# Patient Record
Sex: Female | Born: 1970 | Race: White | Hispanic: No | Smoking: Former smoker
Health system: Southern US, Community
[De-identification: ages and names within clinical notes are randomized; demographics above are authoritative.]

## PROBLEM LIST (undated history)

## (undated) ENCOUNTER — Inpatient Hospital Stay: Admission: EM | Payer: Self-pay | Source: Home / Self Care

## (undated) DIAGNOSIS — R569 Unspecified convulsions: Secondary | ICD-10-CM

## (undated) DIAGNOSIS — J309 Allergic rhinitis, unspecified: Secondary | ICD-10-CM

## (undated) DIAGNOSIS — S329XXA Fracture of unspecified parts of lumbosacral spine and pelvis, initial encounter for closed fracture: Secondary | ICD-10-CM

## (undated) DIAGNOSIS — J45909 Unspecified asthma, uncomplicated: Secondary | ICD-10-CM

## (undated) DIAGNOSIS — G43809 Other migraine, not intractable, without status migrainosus: Secondary | ICD-10-CM

## (undated) DIAGNOSIS — L309 Dermatitis, unspecified: Secondary | ICD-10-CM

## (undated) HISTORY — DX: Unspecified asthma, uncomplicated: J45.909

## (undated) HISTORY — PX: ABDOMINAL HYSTERECTOMY: SHX81

## (undated) HISTORY — PX: ELBOW SURGERY: SHX618

## (undated) HISTORY — DX: Unspecified convulsions: R56.9

## (undated) HISTORY — PX: OTHER SURGICAL HISTORY: SHX169

## (undated) HISTORY — PX: OVARY SURGERY: SHX727

## (undated) HISTORY — DX: Dermatitis, unspecified: L30.9

## (undated) HISTORY — PX: SPINAL CORD STIMULATOR IMPLANT: SHX2422

## (undated) HISTORY — PX: PELVIC SYMPHYSIS FUSION: SHX2194

## (undated) HISTORY — PX: HAND SURGERY: SHX662

## (undated) HISTORY — PX: KNEE SURGERY: SHX244

## (undated) HISTORY — DX: Allergic rhinitis, unspecified: J30.9

## (undated) HISTORY — PX: CHOLECYSTECTOMY: SHX55

---

## 1999-06-05 ENCOUNTER — Ambulatory Visit (HOSPITAL_COMMUNITY): Admission: RE | Admit: 1999-06-05 | Discharge: 1999-06-05 | Payer: Self-pay | Admitting: Gynecology

## 2002-05-28 ENCOUNTER — Other Ambulatory Visit: Admission: RE | Admit: 2002-05-28 | Discharge: 2002-05-28 | Payer: Self-pay | Admitting: Gynecology

## 2003-02-10 ENCOUNTER — Encounter: Payer: Self-pay | Admitting: Cardiology

## 2003-02-10 ENCOUNTER — Ambulatory Visit (HOSPITAL_COMMUNITY): Admission: RE | Admit: 2003-02-10 | Discharge: 2003-02-10 | Payer: Self-pay | Admitting: Internal Medicine

## 2003-07-20 ENCOUNTER — Other Ambulatory Visit: Admission: RE | Admit: 2003-07-20 | Discharge: 2003-07-20 | Payer: Self-pay | Admitting: Gynecology

## 2004-05-23 ENCOUNTER — Encounter (INDEPENDENT_AMBULATORY_CARE_PROVIDER_SITE_OTHER): Payer: Self-pay | Admitting: Specialist

## 2004-05-23 ENCOUNTER — Ambulatory Visit (HOSPITAL_BASED_OUTPATIENT_CLINIC_OR_DEPARTMENT_OTHER): Admission: RE | Admit: 2004-05-23 | Discharge: 2004-05-23 | Payer: Self-pay | Admitting: Gynecology

## 2004-05-23 ENCOUNTER — Ambulatory Visit (HOSPITAL_COMMUNITY): Admission: RE | Admit: 2004-05-23 | Discharge: 2004-05-23 | Payer: Self-pay | Admitting: Gynecology

## 2005-06-20 ENCOUNTER — Other Ambulatory Visit: Admission: RE | Admit: 2005-06-20 | Discharge: 2005-06-20 | Payer: Self-pay | Admitting: Gynecology

## 2005-09-13 ENCOUNTER — Observation Stay (HOSPITAL_COMMUNITY): Admission: RE | Admit: 2005-09-13 | Discharge: 2005-09-14 | Payer: Self-pay | Admitting: Obstetrics and Gynecology

## 2005-09-13 ENCOUNTER — Encounter (INDEPENDENT_AMBULATORY_CARE_PROVIDER_SITE_OTHER): Payer: Self-pay | Admitting: Specialist

## 2005-09-26 ENCOUNTER — Encounter: Payer: Self-pay | Admitting: Emergency Medicine

## 2005-09-26 ENCOUNTER — Inpatient Hospital Stay (HOSPITAL_COMMUNITY): Admission: AD | Admit: 2005-09-26 | Discharge: 2005-09-26 | Payer: Self-pay | Admitting: Obstetrics and Gynecology

## 2008-07-15 ENCOUNTER — Emergency Department (HOSPITAL_COMMUNITY): Admission: EM | Admit: 2008-07-15 | Discharge: 2008-07-15 | Payer: Self-pay | Admitting: Emergency Medicine

## 2009-10-06 ENCOUNTER — Ambulatory Visit (HOSPITAL_BASED_OUTPATIENT_CLINIC_OR_DEPARTMENT_OTHER)
Admission: RE | Admit: 2009-10-06 | Discharge: 2009-10-06 | Payer: Self-pay | Admitting: Physical Medicine and Rehabilitation

## 2009-11-23 ENCOUNTER — Ambulatory Visit (HOSPITAL_COMMUNITY): Admission: RE | Admit: 2009-11-23 | Discharge: 2009-11-24 | Payer: Self-pay | Admitting: Orthopedic Surgery

## 2010-06-07 ENCOUNTER — Encounter
Admission: RE | Admit: 2010-06-07 | Discharge: 2010-06-07 | Payer: Self-pay | Admitting: Physical Medicine and Rehabilitation

## 2011-04-04 LAB — CBC
HCT: 40.2 % (ref 36.0–46.0)
Hemoglobin: 14 g/dL (ref 12.0–15.0)
MCV: 88.7 fL (ref 78.0–100.0)
RBC: 4.53 MIL/uL (ref 3.87–5.11)
RDW: 12.7 % (ref 11.5–15.5)
WBC: 7.1 10*3/uL (ref 4.0–10.5)

## 2011-05-18 NOTE — H&P (Signed)
NAME:  CACHE, DECOURSEY NO.:  [id number]   MEDICAL RECORD NO.:  [id number]          PATIENT TYPE:  AMB   LOCATION:  [facility]                            FACILITY:   PHYSICIAN:  Juluis Mire, M.D.   DATE OF BIRTH:  07/10/1971   DATE OF ADMISSION:  09/13/2005  DATE OF DISCHARGE:                                HISTORY & PHYSICAL   The patient is a 40 year old gravida 5, para 3, abortus 2 married white  female who presents for laparoscopically-assisted vaginal hysterectomy.   In relation to the present admission, the patient has regular cyclic periods  at the present time.  She has severe pain that starts from mid cycle up to  her period.  Pain tends then to worsen with her cycle.  This pain is  becoming limiting and very uncomfortable for her.  She is not responding to  over-the-counter management.  Her past gynecological history is significant  in that she had what was described as a history of polycystic ovarian  syndrome; although she has always had regular cyclic periods and had no  trouble conceiving.  At age 64, she evidently underwent exploratory surgery.  She was told at that time that her right tube and ovary were missing and she  evidently had some torsion of the left ovary and was told she had a wedge  resection done at that time.  She at the present time has 3 days of  relatively heavy flow with her cycles, but again, the pain is the most  significant issue.  Subsequently, in 2000 she had a laparoscopy done by Dr.  Beather Arbour for evaluation of pelvic pain.  At that time, there was no  evidence of endometriosis.  The right ovary was missing.  She did have  extensive adhesions from the omentum to the anterior abdominal wall and  there were some adhesions in the left ovary that were taken down.  Subsequently, she had a hysteroscopy done in 2005 for abnormal uterine  bleeding unresponsive to conservative therapy and had an endometrial  ablation.  Again, she  has not responded to this in terms of her menstrual  flow or continued pelvic pain.  We did an ultrasound here.  She ha a 2.2 cm  posterior wall fibroid.  We could not fill the intrauterine cavity with  fluid due to the prior endometrial ablation.  Therefore, we are dealing with  menorrhagia and continued pelvic pain unresponsive to an attempt at  conservative management.  We had discussed options with her.  These include  agents such as birth control pills or Depo-Provera.  However, she does have  a history of migraine headaches and worries about this.  We could just  proceed with laparoscopy versus hysterectomy.  The patient is in favor of  the latter procedure, for which she is admitted at the present time.   In terms of allergies, she is allergic to IODINE.   MEDICATIONS:  Glucophage.   PAST MEDICAL HISTORY:  1.  History of migraine headaches.  2.  History of recurrent urinary tract infections.  PAST SURGICAL HISTORY:  1.  Again, at age 30 she has an exploratory surgery with wedge resection of      the left ovary.  2.  As noted above, she had the diagnostic laparoscopy and subsequently last      year hysteroscopy and endometrial ablation.   OBSTETRICAL HISTORY:  She has had three vaginal deliveries and two  miscarriages.   FAMILY HISTORY:  1.  Maternal grandmother with history of diabetes.  2.  Mother and father have a history of hypertension.  3.  Son has a history of seizure disorder.  4.  Maternal grandmother with thyroid disorder.   SOCIAL HISTORY:  No tobacco or alcohol use.   REVIEW OF SYSTEMS:  Noncontributory.   PHYSICAL EXAMINATION:  VITAL SIGNS:  The patient is afebrile with stable  vital signs.  HEENT:  The patient is normocephalic.  Pupils are equal, round, and reactive  to light and accommodation.  Extraocular movements are intact.  Sclerae and  conjunctivae clear.  Oropharynx clear.  NECK:  Without thyromegaly.  BREASTS:  No discrete masses.  LUNGS:   Clear.  CARDIAC SYSTEM:  Regular rhythm and rate without murmurs or gallops.  ABDOMEN:  Benign.  No masses, organomegaly, or tenderness.  Prior  exploratory surgery as noted.  PELVIC:  Normal external genitalia.  Vaginal mucosa is clear.  Cervix  unremarkable.  Uterus is in the upper limits of normal size, moderately  tender.  Adnexa unremarkable.  EXTREMITIES:  Trace edema.  NEUROLOGICAL EXAM:  Grossly within normal limits.   IMPRESSION:  1.  Continue pelvic pain and menorrhagia, possible adenomyosis.  2.  Known pelvic adhesions.  3.  Uterine fibroids.  4.  Questionable history of polycystic ovarian disease.   PLAN:  The patient is to undergo a laparoscopically-assisted vaginal  hysterectomy.  The remaining ovary will be left in place.  The risks of  surgery have been discussed, including the risk of infection.  The risk of  hemorrhage could require a transfusion with the risk of AIDS or hepatitis.  The risk of injury to adjacent organs including bladder, bowel, or ureters  could require further exploratory surgery.  The risk of deep vein thrombosis  and pulmonary embolus.  She does understand with the ovary left in place,  there could be a problem with persistent pelvic pain and discomfort that  could require further surgical management.      Juluis Mire, M.D.  Electronically Signed     JSM/MEDQ  D:  09/13/2005  T:  09/13/2005  Job:  [id number]

## 2011-05-18 NOTE — Op Note (Signed)
NAME:  Christine, Miranda                  ACCOUNT NO.:  [id number]   MEDICAL RECORD NO.:  [id number]          PATIENT TYPE:  OBV   LOCATION:  9399                          FACILITY:  [facility]   PHYSICIAN:  Juluis Mire, M.D.   DATE OF BIRTH:  [birth year]   DATE OF PROCEDURE:  09/13/2005  DATE OF DISCHARGE:                                 OPERATIVE REPORT   PREOPERATIVE DIAGNOSES:  1.  Menorrhagia.  2.  Pelvic pain.  3.  Probable uterine adenomyosis.   POSTOPERATIVE DIAGNOSES:  1.  Menorrhagia.  2.  Pelvic pain.  3.  Probable uterine adenomyosis.   OPERATIVE PROCEDURE:  Laparoscopically-assisted vaginal hysterectomy.   ANESTHESIA:  General endotracheal.   ESTIMATED BLOOD LOSS:  Approximately 300 to 400 mL.   PACKS AND DRAINS:  None.   INTRAOPERATIVE BLOOD REPLACEMENT:  None.   COMPLICATIONS:  None.   INDICATION:  Indications are dictated in the hysterectomy and physical.   PROCEDURE:  The patient was taken to the OR and placed in the supine  position.  After satisfactory level of general endotracheal anesthesia was  obtained, the patient was placed in dorsal lithotomy position using the  Allen stirrups.  The abdomen, perineum, and vagina were prepped out with an  Ancef solution.  The bladder was emptied by in-and-out catheterization.  A  Hulka tenaculum was put in place and secured.  The patient was draped in a  sterile field.  Subumbilical incision made with the knife.  The incision was  extended to the fascia and the fascia was entered sharply.  The muscles were  separated and the peritoneum was entered bluntly with finger pressure.  The  Todd laparoscopic trocar was put in place and secured.  The abdomen was  inflated with carbon dioxide.  The laparoscope was introduced.  There was no  evidence of injury to adjacent organs.  A 5 mm trocar was then placed in the  suprapubic area under direct visualization.  The uterus was upper limits of  normal size.  It appeared that the right  tube and ovary were absent.  The  left ovary was unremarkable.  The uterus was enlarged, consistent with  adenomyosis.  There were no pelvic adhesions or any signs of active process  such as endometriosis.  The appendix was visualized and noted to be normal.  The gallbladder was surgically absent.  At this point in time using the  Gyrus, first the broad ligament on the right side was cauterized, incised,  and separated from the right side of the uterus.  Then on the left side, the  left utero-ovarian pedicle was cauterized and incised.  The left tube and  mesosalpinx were cauterized and incised and the left round ligament was  cauterized and incised.  We also continued the cautery and incision down to  the broad ligament.  We developed the bladder flap using the Gyrus.  We had  good hemostasis and freeing up of the uterus.  At this point in time, the  abdomen was inflated with carbon dioxide and the laparoscope was removed.  The  patient's legs were repositioned and the Hulka tenaculum then removed.  A weighted speculum was placed in the vaginal vault.  The cervix was  captured with Christella Hartigan tenaculum.  Cul-de-sac was entered sharply.  Both  uterosacral ligaments were clamped, cut, and suture ligated with 0 Vicryl.  The reflection of the vaginal mucosa anteriorly was incised and the bladder  was dissected superiorly.  Paracervical tissue was clamped, cut, and suture  ligated with 0 Vicryl.  The vesicouterine space was entered and a retractor  is put in place.  Using the clamp, cut, and tie technique with suture  ligatures of 0 Vicryl, the parametrium was serially separated from the sides  of the uterus.  The uterus was then flipped.  The remaining pedicles were  clamped and cut and the uterus was passed off of the operative field.  Pedicles were secured with free ties of 0 Vicryl.  She had good hemostasis.  The vaginal mucosa was reapproximated in the midline with interrupted figure-  of-8s  and 0 Vicryl.  The Foley was placed to straight drainage with  retrieval of an adequate amount of clear urine.  A sponge on a sponge stick  was placed in the vaginal vault.   The patient's legs were repositioned and the laparoscope was reintroduced.  The abdomen was reinflated with carbon dioxide.  We irrigated the pelvis.  We had some oozing from the vaginal cuff, well controlled with the Gyrus.  Otherwise, we had excellent hemostasis.  The laparoscope was then removed.  The suprapubic trocar was removed.  The Todd trocar was removed.  The  subumbilical fascia was closed with two figure-of-8s of 0 Vicryl, the skin  with interrupted subcuticulars of 4-0 Vicryl.  The suprapubic incision was  closed with Dermabond.  A sponge on a sponge stick was removed from the  vaginal vault.  The patient was taken out of the dorsal supine position.  She was alert and extubated and transferred to the room in good condition.  Sponge, instrument, and needle count was reported correct by the circulating  nurse x2.  Urine output remained clear at the time of closure.      Juluis Mire, M.D.  Electronically Signed     JSM/MEDQ  D:  09/13/2005  T:  09/13/2005  Job:  [id number]

## 2011-05-18 NOTE — Op Note (Signed)
NAME:  Christine Miranda, HACKEL NO.:  [id number]   MEDICAL RECORD NO.:  [id number]                   PATIENT TYPE:  AMB   LOCATION:  [facility]                                 FACILITY:  [facility]   PHYSICIAN:  Gretta Cool, M.D.              DATE OF BIRTH:  [birth year]   DATE OF PROCEDURE:  05/23/2004  DATE OF DISCHARGE:                                 OPERATIVE REPORT   PREOPERATIVE DIAGNOSES:  Abnormal uterine bleeding, persistent unresponsive  to conservative therapy.   POSTOPERATIVE DIAGNOSES:  Abnormal uterine bleeding, persistent unresponsive  to conservative therapy.   PROCEDURE:  Hysteroscopy, resection of the endometrium total for ablation  plus VaporTrode.   SURGEON:  Gretta Cool, M.D.   ANESTHESIA:  IV sedation and paracervical block.   DESCRIPTION OF PROCEDURE:  Under excellent paracervical block with IV  sedation with the patient prepped and draped in Allen stirrups, the cervix  was grasped with a single tooth tenaculum.  He was then progressively  dilated with a series of Pratt dilators to accommodate a 7 mm resectoscope.  The resectoscope was then introduced and the cavity photographed. There were  areas on both the anterior wall and the posterior wall that appeared to be  polyps. The entire endometrial cavity was then systematically resected down  at least 3-5 mm into the myometrium until there were no further gland  openings visible. Once the entire endometrial cavity had been resected, the  cavity was treated by VaporTrode so as to eliminate any islands of  endometrial tissue in the superficial myometrium.  At this point, the  cornual areas are treated by touch technique at reduced pressured.  The  bleeding points were cauterized and at this point, the procedure was  terminated without complications. The patient returned to the recovery room  in excellent condition.                                               Gretta Cool,  M.D.    CWL/MEDQ  D:  05/23/2004  T:  05/23/2004  Job:  [id number]

## 2011-05-18 NOTE — Discharge Summary (Signed)
NAME:  Christine Miranda, Christine Miranda                  ACCOUNT NO.:  [id number]   MEDICAL RECORD NO.:  [id number]          PATIENT TYPE:  OBV   LOCATION:  9320                          FACILITY:  [facility]   PHYSICIAN:  Juluis Mire, M.D.   DATE OF BIRTH:  07-24-1971   DATE OF ADMISSION:  09/13/2005  DATE OF DISCHARGE:  09/14/2005                                 DISCHARGE SUMMARY   ADMISSION DIAGNOSIS:  Adenomyosis.   DISCHARGE DIAGNOSIS:  Adenomyosis.   OPERATIVE PROCEDURE:  Laparoscopically-assisted vaginal hysterectomy.   For complete history and physical, please see the dictated note.   COURSE IN THE HOSPITAL:  The patient underwent laparoscopically-assisted  vaginal hysterectomy.  Postop hemoglobin was 11.1, discharged home on the  first postop day.  That morning she did have some nausea.  That afternoon,  she was tolerating a diet with no nausea.  She was afebrile with stable  vital signs.  Abdomen was soft, nontender, bowel sounds were active.  She  had passed flatus and was voiding without difficulty.   In terms of complications, none were encountered during the stay in the  hospital.  The patient was discharged home in stable condition.   DISPOSITION:  Routine postop instructions were given.  She is to avoid heavy  lifting, vaginal entry and driving a care.  She is to watch for signs of  infection, nausea or vomiting, increasing abdominal pain or active vaginal  bleeding.  Follow-up in the office will be in one week.  Sent home on  Percocet as she needs for pain.      Juluis Mire, M.D.  Electronically Signed     JSM/MEDQ  D:  09/14/2005  T:  09/14/2005  Job:  [id number]

## 2011-09-28 LAB — DIFFERENTIAL
Lymphocytes Relative: 24
Monocytes Relative: 7

## 2011-09-28 LAB — URINALYSIS, ROUTINE W REFLEX MICROSCOPIC
Bilirubin Urine: NEGATIVE
Ketones, ur: NEGATIVE
Specific Gravity, Urine: 1.027
pH: 5.5

## 2011-09-28 LAB — CBC
HCT: 42.9
Hemoglobin: 14.8
MCHC: 34.4
MCV: 86.4
Platelets: 278
RDW: 12.6
WBC: 11 — ABNORMAL HIGH

## 2011-09-28 LAB — COMPREHENSIVE METABOLIC PANEL
AST: 23
CO2: 24
Chloride: 106
GFR calc Af Amer: >60
Glucose, Bld: 101 — ABNORMAL HIGH
Potassium: 3.8
Sodium: 139
Total Bilirubin: 0.7

## 2013-07-14 ENCOUNTER — Encounter (HOSPITAL_COMMUNITY): Payer: Self-pay | Admitting: Emergency Medicine

## 2013-07-14 ENCOUNTER — Emergency Department (HOSPITAL_COMMUNITY): Payer: BC Managed Care – PPO

## 2013-07-14 ENCOUNTER — Emergency Department (HOSPITAL_COMMUNITY)
Admission: EM | Admit: 2013-07-14 | Discharge: 2013-07-15 | Disposition: A | Discharge status: Home / Self Care | Payer: BC Managed Care – PPO | Attending: Emergency Medicine | Admitting: Emergency Medicine

## 2013-07-14 DIAGNOSIS — N83202 Unspecified ovarian cyst, left side: Secondary | ICD-10-CM

## 2013-07-14 DIAGNOSIS — Z79899 Other long term (current) drug therapy: Secondary | ICD-10-CM | POA: Insufficient documentation

## 2013-07-14 DIAGNOSIS — R109 Unspecified abdominal pain: Secondary | ICD-10-CM

## 2013-07-14 DIAGNOSIS — N83209 Unspecified ovarian cyst, unspecified side: Secondary | ICD-10-CM | POA: Insufficient documentation

## 2013-07-14 DIAGNOSIS — Z8781 Personal history of (healed) traumatic fracture: Secondary | ICD-10-CM | POA: Insufficient documentation

## 2013-07-14 DIAGNOSIS — G43909 Migraine, unspecified, not intractable, without status migrainosus: Secondary | ICD-10-CM | POA: Insufficient documentation

## 2013-07-14 HISTORY — DX: Fracture of unspecified parts of lumbosacral spine and pelvis, initial encounter for closed fracture: S32.9XXA

## 2013-07-14 HISTORY — DX: Other migraine, not intractable, without status migrainosus: G43.809

## 2013-07-14 LAB — COMPREHENSIVE METABOLIC PANEL
ALT: 14 U/L (ref 0–35)
AST: 17 U/L (ref 0–37)
Albumin: 4.3 g/dL (ref 3.5–5.2)
Creatinine, Ser: 0.49 mg/dL — ABNORMAL LOW (ref 0.50–1.10)
GFR calc Af Amer: >90 mL/min (ref >90)
GFR calc non Af Amer: >90 mL/min (ref >90)
Glucose, Bld: 113 mg/dL — ABNORMAL HIGH (ref 70–99)
Sodium: 138 mEq/L (ref 135–145)
Total Protein: 7.8 g/dL (ref 6.0–8.3)

## 2013-07-14 LAB — CBC WITH DIFFERENTIAL/PLATELET
Basophils Relative: 0 % (ref 0–1)
Eosinophils Absolute: 0.1 10*3/uL (ref 0.0–0.7)
Eosinophils Relative: 2 % (ref 0–5)
HCT: 41.6 % (ref 36.0–46.0)
MCH: 29.6 pg (ref 26.0–34.0)
MCHC: 35.8 g/dL (ref 30.0–36.0)
Monocytes Absolute: 0.8 10*3/uL (ref 0.1–1.0)
Monocytes Relative: 8 % (ref 3–12)
Neutro Abs: 4.5 10*3/uL (ref 1.7–7.7)
Platelets: 253 10*3/uL (ref 150–400)
RDW: 12.5 % (ref 11.5–15.5)
WBC: 9.4 10*3/uL (ref 4.0–10.5)

## 2013-07-14 LAB — URINALYSIS, ROUTINE W REFLEX MICROSCOPIC
Bilirubin Urine: NEGATIVE
Hgb urine dipstick: NEGATIVE
Ketones, ur: NEGATIVE mg/dL
Protein, ur: NEGATIVE mg/dL
Specific Gravity, Urine: 1.013 (ref 1.005–1.030)

## 2013-07-14 MED ORDER — KETOROLAC TROMETHAMINE 30 MG/ML IJ SOLN
30.0000 mg | Freq: Once | INTRAMUSCULAR | Status: AC
  Administered 2013-07-14: 30 mg via INTRAVENOUS
  Filled 2013-07-14: qty 1

## 2013-07-14 MED ORDER — HYDROMORPHONE HCL PF 1 MG/ML IJ SOLN
1.0000 mg | Freq: Once | INTRAMUSCULAR | Status: AC
  Administered 2013-07-14: 1 mg via INTRAVENOUS
  Filled 2013-07-14: qty 1

## 2013-07-14 MED ORDER — FENTANYL CITRATE 0.05 MG/ML IJ SOLN
50.0000 ug | Freq: Once | INTRAMUSCULAR | Status: AC
  Administered 2013-07-14: 50 ug via INTRAVENOUS
  Filled 2013-07-14: qty 2

## 2013-07-14 MED ORDER — POTASSIUM CHLORIDE CRYS ER 20 MEQ PO TBCR
40.0000 meq | EXTENDED_RELEASE_TABLET | Freq: Once | ORAL | Status: AC
  Administered 2013-07-14: 40 meq via ORAL
  Filled 2013-07-14: qty 2

## 2013-07-14 MED ORDER — SODIUM CHLORIDE 0.9 % IV SOLN
Freq: Once | INTRAVENOUS | Status: AC
  Administered 2013-07-14: 20:00:00 via INTRAVENOUS

## 2013-07-14 NOTE — ED Notes (Signed)
Pt states she has been having left lower abdominal pain all day that worsened this evening.  Pt states pain is now severe and she is unable to walk-pt brought from waiting area to room to be triaged.  Pt c/o nausea but no emesis.  Pt states she has not been able to take any pain meds today because of the nausea.  Pt states she is in pain management clinic and has a spinal stimulator.

## 2013-07-14 NOTE — ED Provider Notes (Signed)
History    CSN: [id number] Arrival date & time 07/14/13  1939  First MD Initiated Contact with Patient 07/14/13 2007     Chief Complaint  Patient presents with  . Abdominal Pain   (Consider location/radiation/quality/duration/timing/severity/associated sxs/prior Treatment) HPI Comments: 42 yo female with left lower pelvic pain since earlier tonight.  Severe, gradual onset, similar to previous cyst hx.  Pt on dilaudid at home for back pain.  Pain mgmt clinic.  No vaginal sxs.  Non radiating.  Sharp/ ache.  Pt had partial hysterectomy hx.    Patient is a 42 y.o. female presenting with abdominal pain. The history is provided by the patient.  Abdominal Pain This is a recurrent problem. Associated symptoms include abdominal pain. Pertinent negatives include no chest pain, no headaches and no shortness of breath.   Past Medical History  Diagnosis Date  . Pelvic fracture   . Lower half migraine    Past Surgical History  Procedure Laterality Date  . Abdominal hysterectomy    . Spinal cord stimulator implant     History reviewed. No pertinent family history. History  Substance Use Topics  . Smoking status: Never Smoker   . Smokeless tobacco: Not on file  . Alcohol Use: No   OB History   Grav Para Term Preterm Abortions TAB SAB Ect Mult Living                 Review of Systems  Constitutional: Negative for fever and chills.  HENT: Negative for neck pain and neck stiffness.   Eyes: Negative for visual disturbance.  Respiratory: Negative for shortness of breath.   Cardiovascular: Negative for chest pain.  Gastrointestinal: Positive for nausea and abdominal pain. Negative for vomiting.  Genitourinary: Positive for pelvic pain. Negative for dysuria and flank pain.  Musculoskeletal: Negative for back pain.  Skin: Negative for rash.  Neurological: Negative for light-headedness and headaches.    Allergies  Hydrocodone; Iodine; and Prednisone  Home Medications   Current  Outpatient Rx  Name  Route  Sig  Dispense  Refill  . HYDROmorphone (DILAUDID) 4 MG tablet   Oral   Take 4 mg by mouth every 4 (four) hours as needed for pain.         Marland Kitchen HYDROmorphone HCl (EXALGO) 16 MG T24A   Oral   Take 32 mg by mouth at bedtime.         . methocarbamol (ROBAXIN) 750 MG tablet   Oral   Take 750 mg by mouth 4 (four) times daily as needed (for muscle spasms).         . methotrexate (RHEUMATREX) 2.5 MG tablet   Oral   Take 12.5 mg by mouth once a week. Caution:Chemotherapy. Protect from light.  Taken on Tuesdays.          BP 143/88  Pulse 104  Temp(Src) 98.3 F (36.8 C) (Oral)  Resp 19  SpO2 100% Physical Exam  Nursing note and vitals reviewed. Constitutional: She is oriented to person, place, and time. She appears well-developed and well-nourished.  HENT:  Head: Normocephalic and atraumatic.  Eyes: Conjunctivae are normal. Right eye exhibits no discharge. Left eye exhibits no discharge.  Neck: Normal range of motion. Neck supple. No tracheal deviation present.  Cardiovascular: Regular rhythm.   Pulmonary/Chest: Effort normal and breath sounds normal.  Abdominal: Soft. She exhibits no distension. There is tenderness (left lower pelvic). There is no guarding.  Musculoskeletal: She exhibits no edema.  Neurological: She is alert and  oriented to person, place, and time.  Skin: Skin is warm. No rash noted.  Psychiatric: She has a normal mood and affect.    ED Course  Procedures (including critical care time) Labs Reviewed  COMPREHENSIVE METABOLIC PANEL - Abnormal; Notable for the following:    Potassium 3.2 (*)    Glucose, Bld 113 (*)    Creatinine, Ser 0.49 (*)    All other components within normal limits  CBC WITH DIFFERENTIAL  URINALYSIS, ROUTINE W REFLEX MICROSCOPIC   US Transvaginal Non-ob  07/14/2013   *RADIOLOGY REPORT*  Clinical Data: Abdominal pain. Left lower quadrant pain.  TRANSABDOMINAL AND TRANSVAGINAL ULTRASOUND OF PELVIS  Technique:  Both transabdominal and transvaginal ultrasound examinations of the pelvis were performed. Transabdominal technique was performed for global imaging of the pelvis including uterus, ovaries, adnexal regions, and pelvic cul-de-sac.  It was necessary to proceed with endovaginal exam following the transabdominal exam to visualize the adnexal tissue.  Comparison:  09/26/2005  Findings:  Uterus: Surgically removed  Right ovary:  Not visualized.  Left ovary:   Left ovary measures 5.2 x 3.2 x 3.1 cm.  Within the left ovary, there is a hypoechoic structure that measures 3.2 x 2.6 x 2.4 cm.  There is no significant flow associated with the cystic structure.  Other findings: Small amount of free fluid.  IMPRESSION: Left ovarian cyst measuring up to 3.2 cm.  Small amount of free fluid in the pelvis.  Right ovary is not visualized.   Original Report Authenticated By: Richarda Overlie, M.D.   US Pelvis Complete  07/14/2013   *RADIOLOGY REPORT*  Clinical Data: Abdominal pain. Left lower quadrant pain.  TRANSABDOMINAL AND TRANSVAGINAL ULTRASOUND OF PELVIS Technique:  Both transabdominal and transvaginal ultrasound examinations of the pelvis were performed. Transabdominal technique was performed for global imaging of the pelvis including uterus, ovaries, adnexal regions, and pelvic cul-de-sac.  It was necessary to proceed with endovaginal exam following the transabdominal exam to visualize the adnexal tissue.  Comparison:  09/26/2005  Findings:  Uterus: Surgically removed  Right ovary:  Not visualized.  Left ovary:   Left ovary measures 5.2 x 3.2 x 3.1 cm.  Within the left ovary, there is a hypoechoic structure that measures 3.2 x 2.6 x 2.4 cm.  There is no significant flow associated with the cystic structure.  Other findings: Small amount of free fluid.  IMPRESSION: Left ovarian cyst measuring up to 3.2 cm.  Small amount of free fluid in the pelvis.  Right ovary is not visualized.   Original Report Authenticated By: Richarda Overlie, M.D.   1. Left ovarian cyst   2. Abdominal pain     MDM  Similar to previous.  Pt has ob gyn to call for fup.  Rechecked, pain improved in ED.  [place] reviewed, pt comfortable with out pt fup. DC  Enid Skeens, MD 07/14/13 [phone]

## 2015-07-09 ENCOUNTER — Ambulatory Visit (INDEPENDENT_AMBULATORY_CARE_PROVIDER_SITE_OTHER): Payer: 59 | Admitting: Urgent Care

## 2015-07-09 VITALS — BP 144/102 | HR 97 | Temp 98.6°F | Resp 18 | Ht 64.5 in | Wt 163.8 lb

## 2015-07-09 DIAGNOSIS — S61219A Laceration without foreign body of unspecified finger without damage to nail, initial encounter: Secondary | ICD-10-CM | POA: Diagnosis not present

## 2015-07-09 DIAGNOSIS — L089 Local infection of the skin and subcutaneous tissue, unspecified: Secondary | ICD-10-CM | POA: Diagnosis not present

## 2015-07-09 MED ORDER — SULFAMETHOXAZOLE-TRIMETHOPRIM 800-160 MG PO TABS: 1.0000 | ORAL_TABLET | Freq: Two times a day (BID) | ORAL | Status: DC

## 2015-07-09 NOTE — Patient Instructions (Signed)
Fingertip Infection °When an infection is around the nail, it is called a paronychia. When it appears over the tip of the finger, it is called a felon. These infections are due to minor injuries or cracks in the skin. If they are not treated properly, they can lead to bone infection and permanent damage to the fingernail. °Incision and drainage is necessary if a pus pocket (an abscess) has formed. Antibiotics and pain medicine may also be needed. Keep your hand elevated for the next 2-3 days to reduce swelling and pain. If a pack was placed in the abscess, it should be removed in 1-2 days by your caregiver. Soak the finger in warm water for 20 minutes 4 times daily to help promote drainage. °Keep the hands as dry as possible. Wear protective gloves with cotton liners. See your caregiver for follow-up care as recommended.  °HOME CARE INSTRUCTIONS  °· Keep wound clean, dry and dressed as suggested by your caregiver. °· Soak in warm salt water for fifteen minutes, four times per day for bacterial infections. °· Your caregiver will prescribe an antibiotic if a bacterial infection is suspected. Take antibiotics as directed and finish the prescription, even if the problem appears to be improving before the medicine is gone. °· Only take over-the-counter or prescription medicines for pain, discomfort, or fever as directed by your caregiver. °SEEK IMMEDIATE MEDICAL CARE IF: °· There is redness, swelling, or increasing pain in the wound. °· Pus or any other unusual drainage is coming from the wound. °· An unexplained oral temperature above 102° F (38.9° C) develops. °· You notice a foul smell coming from the wound or dressing. °MAKE SURE YOU:  °· Understand these instructions. °· Monitor your condition. °· Contact your caregiver if you are getting worse or not improving. °Document Released: 01/24/2005 Document Revised: 03/10/2012 Document Reviewed: 01/20/2009 °[employer]® Patient Information ©2015 [employer]. This  information is not intended to replace advice given to you by your health care provider. Make sure you discuss any questions you have with your health care provider. ° °

## 2015-07-09 NOTE — Progress Notes (Signed)
    MRN: [id number] DOB: 1971-03-15  Subjective:   Christine Miranda is a 44 y.o. female presenting for chief complaint of Hand Pain  Reports 5 day history of finger laceration of right index finger tip. She did not present to clinic then, wound started to close but in the last 2-3 days, developed pain, redness, swelling, oozing of pus. Has tried erythromycin once today. Denies fever, drainage of pus today, worsening swelling, decreased sensation or range of motion. Patient is right handed. Denies any other aggravating or relieving factors, no other questions or concerns.  Christine Miranda has a current medication list which includes the following prescription(s): hydromorphone, hydromorphone hcl, and methocarbamol. She is allergic to hydrocodone; iodine; and prednisone.  Christine Miranda  has a past medical history of Pelvic fracture and Lower half migraine. Also  has past surgical history that includes Abdominal hysterectomy and Spinal cord stimulator implant.  ROS As in subjective.  Objective:   Vitals: BP 144/102 mmHg  Pulse 97  Temp(Src) 98.6 F (37 C) (Oral)  Resp 18  Ht 5' 4.5" (1.638 m)  Wt 163 lb 12.8 oz (74.299 kg)  BMI 27.69 kg/m2  SpO2 98%  BP on recheck by PA-Khalib Fendley 126/88 on left arm at 15:14.  Physical Exam  Constitutional: She is oriented to person, place, and time. She appears well-developed and well-nourished.  Cardiovascular: Normal rate.   Pulmonary/Chest: Effort normal.  Musculoskeletal:       Right hand: She exhibits tenderness (over right index fingertip) and laceration. She exhibits normal range of motion, no bony tenderness, normal capillary refill, no deformity and no swelling. Normal sensation noted. Normal strength noted.       Hands: Neurological: She is alert and oriented to person, place, and time.   Assessment and Plan :   1. Laceration of finger with infection, initial encounter - Will start Septra to cover for MRSA since patient has nausea and stomach upset with  doxycycline. No wound culture or I&D necessary given lack of fluctuance or pus expressed on exam. The fact that wound is closed will not need sutures. - RTC as needed.  Jaynee Eagles, PA-C [facility] [phone] 07/09/2015 2:53 PM

## 2015-10-30 ENCOUNTER — Ambulatory Visit (INDEPENDENT_AMBULATORY_CARE_PROVIDER_SITE_OTHER): Payer: 59 | Admitting: Internal Medicine

## 2015-10-30 VITALS — BP 138/78 | HR 85 | Temp 98.4°F | Resp 16 | Ht 65.0 in | Wt 171.4 lb

## 2015-10-30 DIAGNOSIS — M7989 Other specified soft tissue disorders: Secondary | ICD-10-CM | POA: Diagnosis not present

## 2015-10-30 NOTE — Progress Notes (Signed)
   Subjective:  This chart was scribed for Tami Lin, MD by Thea Alken, ED Scribe. This patient was seen in room 1 and the patient's care was started at 2:38 PM.  Patient ID: Christine Miranda, female    DOB: 1971-03-21, 44 y.o.   MRN: [id number]  HPI   Chief Complaint  Patient presents with  . needs ring cut off    ring on right middle finger needs to be cut off.  finger is swollen and pt has tried every attempt to get the ring off   HPI Comments: Christine Miranda is a 44 y.o. female who presents to the [facility] for removal of a ring. Pt has a ring on her right middle finger that she is needing to be removed. She tried multiple things last night including a dental floss trick. She denies having pain to finger but has had swelling from trying to removed the ring.   Past Medical History  Diagnosis Date  . Pelvic fracture ([place])   . Lower half migraine    Prior to Admission medications   Medication Sig Start Date End Date Taking? Authorizing Provider  HYDROmorphone (DILAUDID) 4 MG tablet Take 4 mg by mouth every 4 (four) hours as needed for pain.   Yes Historical Provider, MD  HYDROmorphone HCl (EXALGO) 16 MG T24A Take 32 mg by mouth at bedtime.   Yes Historical Provider, MD  methocarbamol (ROBAXIN) 750 MG tablet Take 750 mg by mouth 4 (four) times daily as needed (for muscle spasms).   Yes Historical Provider, MD  sulfamethoxazole-trimethoprim (BACTRIM DS,SEPTRA DS) 800-160 MG per tablet Take 1 tablet by mouth 2 (two) times daily. Patient not taking: Reported on 10/30/2015 07/09/15   Jaynee Eagles, PA-C    Review of Systems  Skin: Negative for color change and wound.  Neurological: Negative for weakness and numbness.    Objective:   Physical Exam  Constitutional: She is oriented to person, place, and time. She appears well-developed and well-nourished. No distress.  HENT:  Head: Normocephalic and atraumatic.  Eyes: Conjunctivae and EOM are normal.  Neck: Neck  supple.  Cardiovascular: Normal rate.   Pulmonary/Chest: Effort normal.  Musculoskeletal: Normal range of motion.  Neurological: She is alert and oriented to person, place, and time.  Skin: Skin is warm and dry.  Right middle finger is mildly swollen at PIP joint. Ring that is unable to be removed.   Psychiatric: She has a normal mood and affect. Her behavior is normal.  Nursing note and vitals reviewed.  Filed Vitals:   10/30/15 1339  BP: 138/78  Pulse: 85  Temp: 98.4 F (36.9 C)  TempSrc: Oral  Resp: 16  Height: 5\' 5"  (1.651 m)  Weight: 171 lb 6 oz (77.735 kg)  SpO2: 98%    Procedre: with ringcutter this ring was cut ,spread w/ forceps and removed No lac Joint wnl  Assessment & Plan:  Swollen finger unable to remove ring  By signing my name below, I, Raven Small, attest that this documentation has been prepared under the direction and in the presence of Tami Lin, MD.  Electronically Signed: Thea Alken, ED Scribe. 10/30/2015. 3:06 PM.  I have completed the patient encounter in its entirety as documented by the scribe, with editing by me where necessary. Orris Perin P. Laney Pastor, M.D.

## 2016-07-30 ENCOUNTER — Telehealth: Payer: Self-pay | Admitting: Physician Assistant

## 2016-07-30 ENCOUNTER — Ambulatory Visit (INDEPENDENT_AMBULATORY_CARE_PROVIDER_SITE_OTHER): Payer: 59 | Admitting: Physician Assistant

## 2016-07-30 ENCOUNTER — Ambulatory Visit (INDEPENDENT_AMBULATORY_CARE_PROVIDER_SITE_OTHER): Payer: 59

## 2016-07-30 ENCOUNTER — Encounter: Payer: Self-pay | Admitting: Physician Assistant

## 2016-07-30 VITALS — BP 160/100 | HR 121 | Temp 98.3°F | Resp 17 | Ht 65.0 in | Wt 167.0 lb

## 2016-07-30 DIAGNOSIS — G8929 Other chronic pain: Secondary | ICD-10-CM | POA: Insufficient documentation

## 2016-07-30 DIAGNOSIS — R05 Cough: Secondary | ICD-10-CM | POA: Diagnosis not present

## 2016-07-30 DIAGNOSIS — R000 Tachycardia, unspecified: Secondary | ICD-10-CM

## 2016-07-30 DIAGNOSIS — G43909 Migraine, unspecified, not intractable, without status migrainosus: Secondary | ICD-10-CM | POA: Insufficient documentation

## 2016-07-30 DIAGNOSIS — M549 Dorsalgia, unspecified: Secondary | ICD-10-CM

## 2016-07-30 DIAGNOSIS — R059 Cough, unspecified: Secondary | ICD-10-CM

## 2016-07-30 LAB — POCT CBC
Granulocyte percent: 70.8 %G (ref 37–80)
HCT, POC: 41.3 % (ref 37.7–47.9)
Hemoglobin: 15 g/dL (ref 12.2–16.2)
LYMPH, POC: 2.2 (ref 0.6–3.4)
MCH, POC: 30.7 pg (ref 27–31.2)
MCHC: 36.2 g/dL — AB (ref 31.8–35.4)
MCV: 84.7 fL (ref 80–97)
MID (cbc): 0.5 (ref 0–0.9)
MPV: 6.5 fL (ref 0–99.8)
PLATELET COUNT, POC: 291 10*3/uL (ref 142–424)
POC Granulocyte: 6.5 (ref 2–6.9)
POC LYMPH %: 23.9 % (ref 10–50)
POC MID %: 5.3 %M (ref 0–12)
RBC: 4.88 M/uL (ref 4.04–5.48)
RDW, POC: 12.9 %
WBC: 9.2 10*3/uL (ref 4.6–10.2)

## 2016-07-30 LAB — D-DIMER, QUANTITATIVE (NOT AT ARMC): D DIMER QUANT: 0.25 ug{FEU}/mL (ref <0.50)

## 2016-07-30 MED ORDER — MONTELUKAST SODIUM 10 MG PO TABS: 10.0000 mg | ORAL_TABLET | Freq: Every day | ORAL | 3 refills | Status: DC

## 2016-07-30 MED ORDER — LEVALBUTEROL HCL 0.63 MG/3ML IN NEBU
0.6300 mg | INHALATION_SOLUTION | Freq: Once | RESPIRATORY_TRACT | Status: AC
  Administered 2016-07-30: 0.63 mg via RESPIRATORY_TRACT

## 2016-07-30 MED ORDER — BENZONATATE 100 MG PO CAPS: 100.0000 mg | ORAL_CAPSULE | Freq: Three times a day (TID) | ORAL | 0 refills | Status: DC | PRN

## 2016-07-30 MED ORDER — LEVALBUTEROL TARTRATE 45 MCG/ACT IN AERO: 1.0000 | INHALATION_SPRAY | RESPIRATORY_TRACT | 3 refills | Status: DC | PRN

## 2016-07-30 MED ORDER — PROMETHAZINE-DM 6.25-15 MG/5ML PO SYRP: 5.0000 mL | ORAL_SOLUTION | Freq: Four times a day (QID) | ORAL | 0 refills | Status: DC | PRN

## 2016-07-30 NOTE — Telephone Encounter (Signed)
LMOM on voice mail  -- neg D dimer - pt to monitor her pulse and BP - if her SOB worsens or does not continue to improve with Xopenex she will seek further evaluation - she will recheck as planned for her elevated BP

## 2016-07-30 NOTE — Patient Instructions (Addendum)
Use inhaler, singulair, tessalon, and cough syrup for cough We will call you with D-dimer results today Come in Monday 08/06/16 for bp recheck and referral to asthma specialist  If symptoms worsen, do not improve, seek care immediately   IF you received an x-ray today, you will receive an invoice from [facility]. Please contact [facility] at [phone] with questions or concerns regarding your invoice.   IF you received labwork today, you will receive an invoice from [employer]. Please contact [facility] at [phone] with questions or concerns regarding your invoice.   Our billing staff will not be able to assist you with questions regarding bills from these companies.  You will be contacted with the lab results as soon as they are available. The fastest way to get your results is to activate your My Chart account. Instructions are located on the last page of this paperwork. If you have not heard from [place] regarding the results in 2 weeks, please contact this office.

## 2016-07-30 NOTE — Progress Notes (Signed)
Christine Miranda  MRN: [id number] DOB: 01-28-1971  Subjective:  Christine Miranda is a 44 y.o. female seen in office today for a chief complaint of persistent productive cough x 5 days with thick clear sputum.   Associated SOB after coughing episodes, fatigue, wheezing at night, and seasonal allergies .Denies chills, chest pain,chest tightness, fever, sore throat, leg swelling, recent immobilization, use of hormones, history of cancer, and sick contacts at home.   Has tried OTC cough medicine with minimal relief.   Has history of chronic bronchitis since age 55. Around this time each year, has a flare. Thinks she may have asthma because albuterol and singulair typically help with these episodes but she has never been tested.   Of note, pt was just started on new medication Otezla for psoriasis two days prior to this coughing episode.   Review of Systems  Constitutional: Positive for appetite change. Negative for diaphoresis.  HENT: Positive for postnasal drip and sneezing. Negative for congestion.   Eyes: Negative for itching.  Gastrointestinal: Positive for diarrhea (from medicaiton). Negative for abdominal pain and vomiting.  Neurological: Negative for dizziness and light-headedness.    Patient Active Problem List   Diagnosis Date Noted  . Chronic back pain 07/30/2016  . Migraines 07/30/2016    Current Outpatient Prescriptions on File Prior to Visit  Medication Sig Dispense Refill  . methocarbamol (ROBAXIN) 750 MG tablet Take 750 mg by mouth 4 (four) times daily as needed (for muscle spasms).     No current facility-administered medications on file prior to visit.     Allergies  Allergen Reactions  . Hydrocodone Hives  . Iodine Hives  . Prednisone     Rapid heartrate  . Tincture Of Benzoin [Benzoin]     Blister   Family History  Problem Relation Age of Onset  . Hyperlipidemia Mother   . Hypertension Mother   . Hyperlipidemia Father   . Hypertension Father   . Deep vein  thrombosis Father   . Pulmonary embolism Father     Objective:  BP (!) 160/100 (BP Location: Left Arm, Patient Position: Sitting, Cuff Size: Normal)   Pulse (!) 121   Temp 98.3 F (36.8 C) (Oral)   Resp 17   Ht 5\' 5"  (1.651 m)   Wt 167 lb (75.8 kg)   SpO2 99%   BMI 27.79 kg/m   BP Readings from Last 3 Encounters:  07/30/16 (!) 160/100  10/30/15 138/78  07/09/15 (!) 144/102    Physical Exam  Constitutional: She is oriented to person, place, and time.  Well developed, well nourished, in moderate distress due to persistent cough.   HENT:  Head: Normocephalic and atraumatic.  Eyes: Conjunctivae are normal.  Neck: Normal range of motion.  Cardiovascular: Regular rhythm, normal heart sounds and intact distal pulses.  Tachycardia present.   Pulmonary/Chest: Effort normal. She has decreased breath sounds (diffusely over posterior lung field  ). She has no wheezes. She has no rhonchi. She has no rales.  Musculoskeletal:       Right lower leg: She exhibits no swelling.       Left lower leg: She exhibits no swelling.  No warmth or tenderness to palpation in lower legs bilaterally.  Neurological: She is alert and oriented to person, place, and time. Gait normal.  Skin: Skin is warm and dry.  Psychiatric: Affect normal.  Vitals reviewed.  Results for orders placed or performed in visit on 07/30/16 (from the past 24 hour(s))  POCT CBC  Status: Abnormal   Collection Time: 07/30/16 11:25 AM  Result Value Ref Range   WBC 9.2 4.6 - 10.2 K/uL   Lymph, poc 2.2 0.6 - 3.4   POC LYMPH PERCENT 23.9 10 - 50 %L   MID (cbc) 0.5 0 - 0.9   POC MID % 5.3 0 - 12 %M   POC Granulocyte 6.5 2 - 6.9   Granulocyte percent 70.8 37 - 80 %G   RBC 4.88 4.04 - 5.48 M/uL   Hemoglobin 15.0 12.2 - 16.2 g/dL   HCT, POC 41.3 37.7 - 47.9 %   MCV 84.7 80 - 97 fL   MCH, POC 30.7 27 - 31.2 pg   MCHC 36.2 (A) 31.8 - 35.4 g/dL   RDW, POC 12.9 %   Platelet Count, POC 291 142 - 424 K/uL   MPV 6.5 0 - 99.8 fL     EKG shows sinus tachycardia at 110 bpm with no acute changes  Dg Chest 2 View  Result Date: 07/30/2016 CLINICAL DATA:  Productive cough for 5 days with shortness of breath EXAM: CHEST  2 VIEW COMPARISON:  None. FINDINGS: Lungs are clear. Heart size and pulmonary vascularity are normal. No adenopathy. Thoracic stimulator tip is in the lower thoracic region. No bone lesions. IMPRESSION: No edema or consolidation. Electronically Signed   By: Lowella Grip III M.D.   On: 07/30/2016 11:35  Assessment and Plan :   1. Cough - POCT CBC - DG Chest 2 View; Future - levalbuterol (XOPENEX) nebulizer solution 0.63 mg; Take 3 mLs (0.63 mg total) by nebulization once. - Await results for D-dimer, quantitative (not at [facility])  - benzonatate (TESSALON) 100 MG capsule; Take 1-2 capsules (100-200 mg total) by mouth 3 (three) times daily as needed for cough.  Dispense: 40 capsule; Refill: 0 - promethazine-dextromethorphan (PROMETHAZINE-DM) 6.25-15 MG/5ML syrup; Take 5 mLs by mouth 4 (four) times daily as needed for cough.  Dispense: 118 mL; Refill: 0 - montelukast (SINGULAIR) 10 MG tablet; Take 1 tablet (10 mg total) by mouth at bedtime.  Dispense: 30 tablet; Refill: 3 - levalbuterol (XOPENEX HFA) 45 MCG/ACT inhaler; Inhale 1-2 puffs into the lungs every 4 (four) hours as needed for wheezing.  Dispense: 1 Inhaler; Refill: 3  2. Tachycardia -Other causes of tachycardia are being evaluated - D-dimer, quantitative (not at [facility]) - EKG 12-Lead  3. Elevated Blood pressure -BP recheck in office in one week after symptoms have improved and pt has rested sufficiently   -If symptoms worsen, seek medical care immediately   Tenna Delaine PA-C  [facility] 07/30/2016 12:30 PM

## 2016-08-01 ENCOUNTER — Encounter: Payer: Self-pay | Admitting: Physician Assistant

## 2016-08-06 ENCOUNTER — Ambulatory Visit (INDEPENDENT_AMBULATORY_CARE_PROVIDER_SITE_OTHER): Payer: 59 | Admitting: Physician Assistant

## 2016-08-06 ENCOUNTER — Encounter: Payer: Self-pay | Admitting: Physician Assistant

## 2016-08-06 VITALS — BP 120/80 | HR 93 | Temp 98.3°F | Resp 18 | Ht 65.0 in | Wt 169.0 lb

## 2016-08-06 DIAGNOSIS — R0602 Shortness of breath: Secondary | ICD-10-CM | POA: Diagnosis not present

## 2016-08-06 DIAGNOSIS — J309 Allergic rhinitis, unspecified: Secondary | ICD-10-CM | POA: Diagnosis not present

## 2016-08-06 DIAGNOSIS — R5382 Chronic fatigue, unspecified: Secondary | ICD-10-CM | POA: Diagnosis not present

## 2016-08-06 DIAGNOSIS — R05 Cough: Secondary | ICD-10-CM

## 2016-08-06 DIAGNOSIS — R059 Cough, unspecified: Secondary | ICD-10-CM

## 2016-08-06 LAB — TSH: TSH: 1.18 mIU/L

## 2016-08-06 MED ORDER — FLUTICASONE PROPIONATE 50 MCG/ACT NA SUSP: 2.0000 | Freq: Every day | NASAL | 6 refills | Status: DC

## 2016-08-06 MED ORDER — CETIRIZINE HCL 10 MG PO TABS: 10.0000 mg | ORAL_TABLET | Freq: Every day | ORAL | 11 refills | Status: DC

## 2016-08-06 MED ORDER — BECLOMETHASONE DIPROPIONATE 40 MCG/ACT IN AERS: 2.0000 | INHALATION_SPRAY | Freq: Every day | RESPIRATORY_TRACT | 12 refills | Status: DC

## 2016-08-06 MED ORDER — LEVALBUTEROL HCL 0.63 MG/3ML IN NEBU
0.6300 mg | INHALATION_SOLUTION | Freq: Once | RESPIRATORY_TRACT | Status: AC
  Administered 2016-08-06: 0.63 mg via RESPIRATORY_TRACT

## 2016-08-06 NOTE — Patient Instructions (Addendum)
Use zyrtec, flonase, and qvar daily. Albuterol nebs q 4-6 hrs during SOB episodes Avoid excessive time outdoors Pulm referral sent Sign up for my chart and let me know how medications are working for you Return to clinic if symptoms worsen, do not improve, or as needed     IF you received an x-ray today, you will receive an invoice from [facility]. Please contact [facility] at [phone] with questions or concerns regarding your invoice.   IF you received labwork today, you will receive an invoice from [employer]. Please contact [facility] at [phone] with questions or concerns regarding your invoice.   Our billing staff will not be able to assist you with questions regarding bills from these companies.  You will be contacted with the lab results as soon as they are available. The fastest way to get your results is to activate your My Chart account. Instructions are located on the last page of this paperwork. If you have not heard from [place] regarding the results in 2 weeks, please contact this office.

## 2016-08-06 NOTE — Progress Notes (Signed)
Christine Miranda  MRN: [id number] DOB: December 03, 1971  Subjective:  Christine Miranda is a 45 y.o. female seen in office today for a chief complaint of worsening nonproductive cough x 3 days.   Was seen on  07/30/16 for cough (refer to progress note). Pt notes that she was taking the tessalon perles, promethazine-dm, and singulair as prescribed and was also doing neulizers at home up until last Friday. She began to feel much better, was not waking up throughout the night anymore. BP and pulse were also better, in the normal range.   Pt notes she had a very exhausting weekend without much rest. Yesterday, she went outdoors for a walk and immediately felt throat constrict. Had associated coughing, chest tightness, shortness of breath during episodes, and fatigue. Denies chest pain, palpitations, and leg swelling. Has tried rescue inhaler and nebulizer without any relief.   Pt has never had a full work up by a pulmonologist, but was diagnosed at some point with chronic bronchitis since age 78.   Pt notes that being around mildew and being outside are triggers for these episodes.   In terms of steroids, prednisone shots cause increased heart rate for pt but she has never had corticosteroid inhaler.   Review of Systems  Constitutional: Negative for chills, diaphoresis and fever.  HENT: Positive for congestion (mild ). Negative for sore throat.   Endocrine: Positive for cold intolerance and heat intolerance.    Patient Active Problem List   Diagnosis Date Noted  . Chronic back pain 07/30/2016  . Migraines 07/30/2016    Current Outpatient Prescriptions on File Prior to Visit  Medication Sig Dispense Refill  . Apremilast (OTEZLA PO) Take by mouth.    . benzonatate (TESSALON) 100 MG capsule Take 1-2 capsules (100-200 mg total) by mouth 3 (three) times daily as needed for cough. 40 capsule 0  . HYDROmorphone (DILAUDID) 2 MG tablet Take 2 mg by mouth 4 (four) times daily.    Marland Kitchen levalbuterol (XOPENEX HFA)  45 MCG/ACT inhaler Inhale 1-2 puffs into the lungs every 4 (four) hours as needed for wheezing. 1 Inhaler 3  . methocarbamol (ROBAXIN) 750 MG tablet Take 750 mg by mouth 4 (four) times daily as needed (for muscle spasms).    . montelukast (SINGULAIR) 10 MG tablet Take 1 tablet (10 mg total) by mouth at bedtime. 30 tablet 3  . promethazine-dextromethorphan (PROMETHAZINE-DM) 6.25-15 MG/5ML syrup Take 5 mLs by mouth 4 (four) times daily as needed for cough. 118 mL 0   No current facility-administered medications on file prior to visit.     Allergies  Allergen Reactions  . Hydrocodone Hives  . Iodine Hives  . Prednisone     Rapid heartrate  . Tincture Of Benzoin [Benzoin]     Blister   Social History   Social History  . Marital status: Married    Spouse name: N/A  . Number of children: N/A  . Years of education: N/A   Occupational History  . Not on file.   Social History Main Topics  . Smoking status: Never Smoker  . Smokeless tobacco: Never Used  . Alcohol use No  . Drug use: No  . Sexual activity: Not on file   Other Topics Concern  . Not on file   Social History Narrative  . No narrative on file    Objective:  BP (!) 160/102 (BP Location: Right Arm, Patient Position: Sitting, Cuff Size: Small)   Pulse (!) 107   Temp 98.3  F (36.8 C) (Oral)   Resp 18   Ht 5\' 5"  (1.651 m)   Wt 169 lb (76.7 kg)   SpO2 98%   BMI 28.12 kg/m   Physical Exam  Constitutional: She is oriented to person, place, and time.  Well developed, well nourished, appears in mild distress due to persistent coughing episodes.   HENT:  Head: Normocephalic and atraumatic.  Nose: Mucosal edema present.  Mouth/Throat: Mucous membranes are normal. Posterior oropharyngeal erythema present.  Eyes: Right conjunctiva is injected.  Neck: Normal range of motion.  Cardiovascular: Regular rhythm.  Tachycardia present.   Pulmonary/Chest: Effort normal. She has decreased breath sounds. She has no wheezes.  She has no rhonchi. She has no rales.  Musculoskeletal:       Right lower leg: She exhibits no tenderness and no swelling.       Left lower leg: She exhibits no tenderness and no swelling.  Neurological: She is alert and oriented to person, place, and time. Gait normal.  Skin: Skin is warm and dry.  Psychiatric: Affect normal.  Vitals reviewed.  Patient's cough much improved after nebulizer treatment in office.    Assessment and Plan :   1. Cough 2. Shortness of breath -Suspect an irritant induced cough, educated on avoiding identifiable triggers - levalbuterol (XOPENEX) nebulizer solution 0.63 mg; Take 3 mLs (0.63 mg total) by nebulization once. - beclomethasone (QVAR) 40 MCG/ACT inhaler; Inhale 2 puffs into the lungs daily.  Dispense: 1 Inhaler; Refill: 12 - Ambulatory referral to Pulmonology  3. Chronic fatigue - TSH  4. Allergic rhinitis, unspecified allergic rhinitis type -Educated on importance on gaining better control over allergies - cetirizine (ZYRTEC) 10 MG tablet; Take 1 tablet (10 mg total) by mouth daily.  Dispense: 30 tablet; Refill: 11 - fluticasone (FLONASE) 50 MCG/ACT nasal spray; Place 2 sprays into both nostrils daily.  Dispense: 16 g; Refill: 6  -Return to clinic if symptoms worsen, do not improve, or as needed  Tenna Delaine PA-C  [facility] 08/06/2016 8:54 AM

## 2016-08-08 ENCOUNTER — Encounter: Payer: Self-pay | Admitting: Physician Assistant

## 2016-08-08 ENCOUNTER — Other Ambulatory Visit: Payer: Self-pay | Admitting: Physician Assistant

## 2016-08-08 DIAGNOSIS — R0602 Shortness of breath: Secondary | ICD-10-CM

## 2016-08-08 MED ORDER — LEVALBUTEROL HCL 0.63 MG/3ML IN NEBU: 0.6300 mg | INHALATION_SOLUTION | Freq: Once | RESPIRATORY_TRACT | 12 refills | Status: DC

## 2016-08-08 NOTE — Progress Notes (Signed)
Pt requested xopenex nebulizer we gave her in office for shortness of breath/coughing spells, as her albuterol nebulizers increase her heart rate and she has not experienced these symptoms when she received the xopenex nebulizer treatments in our office the past two visits.   Tenna Delaine, PA-C  [facility] 08/08/2016 3:37 PM

## 2016-08-21 ENCOUNTER — Encounter: Payer: Self-pay | Admitting: Physician Assistant

## 2016-08-21 DIAGNOSIS — R05 Cough: Secondary | ICD-10-CM

## 2016-08-21 DIAGNOSIS — R059 Cough, unspecified: Secondary | ICD-10-CM

## 2016-08-23 ENCOUNTER — Encounter: Payer: Self-pay | Admitting: Physician Assistant

## 2016-08-23 MED ORDER — BENZONATATE 100 MG PO CAPS: 100.0000 mg | ORAL_CAPSULE | Freq: Three times a day (TID) | ORAL | 1 refills | Status: DC | PRN

## 2016-08-23 NOTE — Telephone Encounter (Signed)
Pt requested refill of tessalon perles due to coughing episode she is experiencing after being outdoors. I have given her a prescription for tessalon perles with one refill.  Tenna Delaine, PA-C  [facility] 08/23/2016 10:05 PM

## 2016-08-25 ENCOUNTER — Encounter: Payer: Self-pay | Admitting: Physician Assistant

## 2016-09-18 ENCOUNTER — Ambulatory Visit (INDEPENDENT_AMBULATORY_CARE_PROVIDER_SITE_OTHER): Payer: 59 | Admitting: Pulmonary Disease

## 2016-09-18 ENCOUNTER — Encounter: Payer: Self-pay | Admitting: Pulmonary Disease

## 2016-09-18 ENCOUNTER — Other Ambulatory Visit (INDEPENDENT_AMBULATORY_CARE_PROVIDER_SITE_OTHER): Payer: 59

## 2016-09-18 VITALS — BP 142/84 | HR 102 | Ht 66.0 in | Wt 172.8 lb

## 2016-09-18 DIAGNOSIS — J4550 Severe persistent asthma, uncomplicated: Secondary | ICD-10-CM

## 2016-09-18 DIAGNOSIS — J309 Allergic rhinitis, unspecified: Secondary | ICD-10-CM | POA: Insufficient documentation

## 2016-09-18 DIAGNOSIS — R05 Cough: Secondary | ICD-10-CM

## 2016-09-18 DIAGNOSIS — J302 Other seasonal allergic rhinitis: Secondary | ICD-10-CM

## 2016-09-18 DIAGNOSIS — R059 Cough, unspecified: Secondary | ICD-10-CM

## 2016-09-18 DIAGNOSIS — J45909 Unspecified asthma, uncomplicated: Secondary | ICD-10-CM | POA: Insufficient documentation

## 2016-09-18 LAB — CBC WITH DIFFERENTIAL/PLATELET
BASOS ABS: 0 10*3/uL (ref 0.0–0.1)
Basophils Relative: 0.4 % (ref 0.0–3.0)
EOS ABS: 0.1 10*3/uL (ref 0.0–0.7)
Eosinophils Relative: 0.7 % (ref 0.0–5.0)
HEMATOCRIT: 43.1 % (ref 36.0–46.0)
Hemoglobin: 15 g/dL (ref 12.0–15.0)
LYMPHS PCT: 19.5 % (ref 12.0–46.0)
Lymphs Abs: 1.6 10*3/uL (ref 0.7–4.0)
MCHC: 34.9 g/dL (ref 30.0–36.0)
MCV: 86.3 fl (ref 78.0–100.0)
Monocytes Absolute: 0.6 10*3/uL (ref 0.1–1.0)
Monocytes Relative: 7.1 % (ref 3.0–12.0)
NEUTROS ABS: 6.1 10*3/uL (ref 1.4–7.7)
Neutrophils Relative %: 72.3 % (ref 43.0–77.0)
PLATELETS: 307 10*3/uL (ref 150.0–400.0)
RBC: 5 Mil/uL (ref 3.87–5.11)
RDW: 12.7 % (ref 11.5–15.5)
WBC: 8.4 10*3/uL (ref 4.0–10.5)

## 2016-09-18 MED ORDER — BUDESONIDE-FORMOTEROL FUMARATE 160-4.5 MCG/ACT IN AERO: 2.0000 | INHALATION_SPRAY | Freq: Two times a day (BID) | RESPIRATORY_TRACT | 6 refills | Status: DC

## 2016-09-18 MED ORDER — AEROCHAMBER Z-STAT PLUS CHAMBR MISC: 0 refills | Status: DC

## 2016-09-18 NOTE — Patient Instructions (Addendum)
   Call me if you have any new breathing problems before your next appointment.  We will review your test results at your follow-up appointment.  Remember to rinse, gargle, and brush your teeth/tongue after you use the Symbicort inhaler.  Remember to use your Symbicort inhaler with a spacer.  Stop using the Qvar but continue to use your other medications for now.  I will see you back in 6 weeks.  TESTS ORDERED: 1. CT Chest w/o 2. Full PFTs before next appointment 3. Serum CBC with differential & RAST Panel today

## 2016-09-18 NOTE — Progress Notes (Signed)
Subjective:    Patient ID: Christine Miranda, female    DOB: [birth year], 45 y.o.   MRN: [id number]  HPI She reports she has had dyspnea since she was 45 y.o. She has been told multiple different diagnoses. She reports her dyspnea is seasonal along with a cough that seems to be seasonal as well. She reports she has more problems with this cough & dyspnea in the Spring & Fall but sometimes in the Summer. She reports dyspnea with exertion & also at rest. She reports she does wake up at night with dyspnea anywhere from 1-7 nights a week. She reports mildew seems to trigger her dyspnea & cough. She does report some irritation of her symptoms with smells. She has had intermittent wheezing. She reports significant fatigue with her symptoms as well. In the past she has taken Qvar, Albuterol, & Advair. She has used a nebulizer and rescue inhaler in the past. She has also been on Singulair without any relief. She reports nebulizer treatments seems to be most effective at controlling her cough. She reports she has been told she had bronchitis in the past but has not had recurrent treatment with antibiotics or steroids. She reports chronic sinus congestion that is worse in the Spring, Summer & Fall. She has been on Flonase and Zyrtec without significant help. She does have frequent sneezing. Denies any facial pain. She isn't sure she has post-nasal drainage. No reflux, dyspepsia, or morning brash water taste. No fever, chills, or sweats. She started back on the Qvar 5-6 weeks ago without significant help. She does have some chest tightness but no frank chest pain.   Review of Systems No new rashes or bruising. Has chronic eczema & psoriasis. She does have swelling in her ankles & fingers as well as stiffness in her hands in the morning. A pertinent 14 point review of systems is negative except as per the history of presenting illness. Allergies  Allergen Reactions  . Hydrocodone Hives  . Iodine Hives  . Prednisone    Rapid heartrate  . Tincture Of Benzoin [Benzoin]     Blister    Current Outpatient Prescriptions on File Prior to Visit  Medication Sig Dispense Refill  . beclomethasone (QVAR) 40 MCG/ACT inhaler Inhale 2 puffs into the lungs daily. 1 Inhaler 12  . benzonatate (TESSALON) 100 MG capsule Take 1-2 capsules (100-200 mg total) by mouth 3 (three) times daily as needed for cough. 40 capsule 1  . cetirizine (ZYRTEC) 10 MG tablet Take 1 tablet (10 mg total) by mouth daily. 30 tablet 11  . fluticasone (FLONASE) 50 MCG/ACT nasal spray Place 2 sprays into both nostrils daily. 16 g 6  . HYDROmorphone (DILAUDID) 2 MG tablet Take 2 mg by mouth 3 (three) times daily.     Marland Kitchen levalbuterol (XOPENEX HFA) 45 MCG/ACT inhaler Inhale 1-2 puffs into the lungs every 4 (four) hours as needed for wheezing. 1 Inhaler 3  . levalbuterol (XOPENEX) 0.63 MG/3ML nebulizer solution Take 3 mLs (0.63 mg total) by nebulization once. 3 mL 12  . methocarbamol (ROBAXIN) 750 MG tablet Take 750 mg by mouth 4 (four) times daily as needed (for muscle spasms).    . montelukast (SINGULAIR) 10 MG tablet Take 1 tablet (10 mg total) by mouth at bedtime. 30 tablet 3  . promethazine-dextromethorphan (PROMETHAZINE-DM) 6.25-15 MG/5ML syrup Take 5 mLs by mouth 4 (four) times daily as needed for cough. (Patient not taking: Reported on 09/18/2016) 118 mL 0   No current facility-administered medications  on file prior to visit.     Past Medical History:  Diagnosis Date  . Allergic rhinitis   . Asthma   . Lower half migraine   . Pelvic fracture [facility])     Past Surgical History:  Procedure Laterality Date  . ABDOMINAL HYSTERECTOMY    . arm surgery    . CHOLECYSTECTOMY    . ELBOW SURGERY    . HAND SURGERY    . KNEE SURGERY     x 2  . OVARY SURGERY    . PELVIC SYMPHYSIS FUSION    . SPINAL CORD STIMULATOR IMPLANT      Family History  Problem Relation Age of Onset  . Hyperlipidemia Mother   . Hypertension Mother   . Rheumatologic disease  Mother   . Hyperlipidemia Father   . Deep vein thrombosis Father     Provoked  . Pulmonary embolism Father     Provoked  . Asthma Son   . Arthritis Maternal Aunt   . Asthma Maternal Grandmother   . Diabetes Maternal Grandmother     Social History   Social History  . Marital status: Married    Spouse name: N/A  . Number of children: N/A  . Years of education: N/A   Social History Main Topics  . Smoking status: Passive Smoke Exposure - Never Smoker  . Smokeless tobacco: Never Used     Comment: Stepfather for 5 years   . Alcohol use 0.6 - 1.2 oz/week    1 - 2 Glasses of wine per week  . Drug use: No  . Sexual activity: Not Asked   Other Topics Concern  . None   Social History Narrative   Originally from [place], [place]. Has lived in [place], [place], [place], [place]. Moved to [place] in 1996. She has 2 [employer]. She does have some exposure to cleaning chemical fumes. Has a cat. No bird, mold, or hot tub exposure. Enjoys shopping.       Objective:   Physical Exam BP (!) 142/84 (BP Location: Right Arm, Cuff Size: Normal)   Pulse (!) 102   Ht 5\' 6"  (1.676 m)   Wt 172 lb 12.8 oz (78.4 kg)   SpO2 98%   BMI 27.89 kg/m  General:  Awake. Alert. No acute distress.   Integument:  Warm & dry. No rash on exposed skin. No bruising. Lymphatics:  No appreciated cervical or supraclavicular lymphadenoapthy. HEENT:  Moist mucus membranes. No oral ulcers. No scleral injection or icterus. No significant nasal turbinate swelling. Cardiovascular:  Regular rate. No edema. No appreciable JVD.  Pulmonary:  Good aeration & clear to auscultation bilaterally. Symmetric chest wall expansion. No accessory muscle use. Abdomen: Soft. Normal bowel sounds. Nondistended. No mass appreciated. Musculoskeletal:  Normal bulk and tone. Hand grip strength 5/5 bilaterally. No joint deformity or effusion appreciated. Neurological:  CN 2-12 grossly in tact. No meningismus. Moving all 4 extremities  equally. Symmetric brachioradialis deep tendon reflexes. Psychiatric:  Mood and affect congruent. Speech normal rhythm, rate & tone.   IMAGING CXR PA/LAT 07/30/16 (personally reviewed by me): No focal opacity, mass, or nodule appreciated. No pleural effusion appreciated. Heart normal in size & mediastinum normal in contour.  LABS 07/30/16 CBC: 9.2/15.0/41.3/291 D dimer: 0.25    Assessment & Plan:  45 y.o. female with long-standing history of dyspnea as well as intermittent coughing and wheezing that seems to be seasonal. When coupled with her seasonal worsening of her chronic sinusitis pattern is most consistent with allergic  rhinitis and underlying severe, persistent asthma. Her chest x-ray does not reveal any abnormality or cause for her ongoing symptoms. I am going to switch the patient to a combination inhaled medication that may provide her some increased relief rather than her current dose Qvar. I instructed the patient contact my office if she had any new breathing problems or questions before her next appointment.  1. Severe, Persistent Asthma: Checking full pulmonary function testing before next appointment. Switching patient to Symbicort 160/4.5 with spacer. 2. Seasonal Allergic Rhinitis: Continuing patient on Zyrtec, Singulair, & Flonase. Checking CBC with differential & RAST panel. 3. Cough: Checking CT chest without contrast. 4. Follow-up: Patient to return to clinic in 6 weeks or sooner if needed.   Sonia Baller Ashok Cordia, M.D. [facility] Pager:  [phone] After 3pm or if no response, call [phone] 12:25 PM 09/18/16

## 2016-09-19 LAB — RESPIRATORY ALLERGY PROFILE REGION II ~~LOC~~
Allergen, C. Herbarum, M2: <0.1 kU/L
Allergen, Comm Silver Birch, t9: <0.1 kU/L
Allergen, Cottonwood, t14: <0.1 kU/L
Allergen, Mulberry, t76: <0.1 kU/L
Allergen, P. notatum, m1: <0.1 kU/L
Aspergillus fumigatus, m3: <0.1 kU/L
Bermuda Grass: 0.15 kU/L — ABNORMAL HIGH
Cockroach: <0.1 kU/L
Common Ragweed: <0.1 kU/L
IgE (Immunoglobulin E), Serum: 7 kU/L (ref <115)
JOHNSON GRASS: 0.22 kU/L — AB
Pecan/Hickory Tree IgE: <0.1 kU/L
Timothy Grass: 1.03 kU/L — ABNORMAL HIGH

## 2016-09-24 ENCOUNTER — Inpatient Hospital Stay: Admission: RE | Admit: 2016-09-24 | Payer: 59 | Source: Ambulatory Visit

## 2016-10-04 ENCOUNTER — Inpatient Hospital Stay: Admission: RE | Admit: 2016-10-04 | Payer: 59 | Source: Ambulatory Visit

## 2016-10-17 ENCOUNTER — Ambulatory Visit (INDEPENDENT_AMBULATORY_CARE_PROVIDER_SITE_OTHER)
Admission: RE | Admit: 2016-10-17 | Discharge: 2016-10-17 | Disposition: A | Discharge status: Home / Self Care | Payer: 59 | Source: Ambulatory Visit | Attending: Pulmonary Disease | Admitting: Pulmonary Disease

## 2016-10-17 DIAGNOSIS — J4550 Severe persistent asthma, uncomplicated: Secondary | ICD-10-CM

## 2016-10-17 DIAGNOSIS — R05 Cough: Secondary | ICD-10-CM | POA: Diagnosis not present

## 2016-10-17 DIAGNOSIS — R059 Cough, unspecified: Secondary | ICD-10-CM

## 2016-10-18 ENCOUNTER — Telehealth: Payer: Self-pay | Admitting: Pulmonary Disease

## 2016-10-18 NOTE — Telephone Encounter (Signed)
Notes Recorded by Len Blalock, CMA on 10/18/2016 at 12:19 PM EDT atc pt X2, line cut off after pt answered. Wcb. ------  Notes Recorded by Javier Glazier, MD on 10/17/2016 at 7:00 PM EDT Please let the patient know that I reviewed her chest CT scan. She has a nodule in her thyroid that needs to be followed up and evaluated by her primary care physician. Otherwise there is no abnormality in her lungs. Thank you. --------------------------------------------------------------------- lmtcb x1 for pt.

## 2016-10-18 NOTE — Telephone Encounter (Signed)
Spoke with pt. She is aware of results. Nothing further was needed.  

## 2016-10-18 NOTE — Telephone Encounter (Signed)
Patient returned call, CB [phone].  States she was having phone problems where the line was cutting off when she answered.

## 2016-10-19 ENCOUNTER — Ambulatory Visit (INDEPENDENT_AMBULATORY_CARE_PROVIDER_SITE_OTHER): Payer: 59 | Admitting: Physician Assistant

## 2016-10-19 VITALS — BP 160/98 | HR 93 | Temp 98.9°F | Resp 18 | Ht 66.0 in | Wt 172.0 lb

## 2016-10-19 DIAGNOSIS — E041 Nontoxic single thyroid nodule: Secondary | ICD-10-CM

## 2016-10-19 DIAGNOSIS — R030 Elevated blood-pressure reading, without diagnosis of hypertension: Secondary | ICD-10-CM

## 2016-10-19 NOTE — Progress Notes (Signed)
Christine Miranda  MRN: [id number] DOB: 01-24-1971  Subjective:  Christine Miranda is a 45 y.o. female seen in office today for a chief complaint of follow up on thyroid nodule on CT chest. She is seen by pulmonology for chronic cough and they recommended she have a CT chest on 10/17/16. Results from the CT showed a <1cm right thyroid lobe nodule. She was told to follow up with her PCP. Pt's most recent TSH on 08/06/16 was 1.18.   Of note, pt has history of post partum thyroiditis with both children, which resolved. She also has a family history of precancerous thyroid nodule in MGM, and hypothyroidism and hyperthyroidism in her aunts. Her mother has mild hypothyroidism that does not require treatment.  She is following up with pulmonology on 11/07/16.  Review of Systems  Constitutional: Positive for diaphoresis ( hot flashes), fatigue and unexpected weight change (weight gain). Negative for chills and fever.  HENT: Positive for congestion.   Respiratory: Positive for cough and shortness of breath. Negative for wheezing.   Cardiovascular: Positive for palpitations. Negative for chest pain.  Gastrointestinal: Positive for constipation and diarrhea. Negative for abdominal pain, nausea and vomiting.  Endocrine: Positive for cold intolerance.  Genitourinary: Positive for urgency.  Musculoskeletal: Positive for myalgias (in lower legs).  Neurological: Positive for headaches. Negative for dizziness, weakness and light-headedness.  Psychiatric/Behavioral: Positive for dysphoric mood.    Patient Active Problem List   Diagnosis Date Noted  . Asthma 09/18/2016  . Allergic rhinitis 09/18/2016  . Chronic back pain 07/30/2016  . Migraines 07/30/2016    Current Outpatient Prescriptions on File Prior to Visit  Medication Sig Dispense Refill  . budesonide-formoterol (SYMBICORT) 160-4.5 MCG/ACT inhaler Inhale 2 puffs into the lungs 2 (two) times daily. 1 Inhaler 6  . cetirizine (ZYRTEC) 10 MG tablet Take 1  tablet (10 mg total) by mouth daily. 30 tablet 11  . fluticasone (FLONASE) 50 MCG/ACT nasal spray Place 2 sprays into both nostrils daily. 16 g 6  . levalbuterol (XOPENEX HFA) 45 MCG/ACT inhaler Inhale 1-2 puffs into the lungs every 4 (four) hours as needed for wheezing. 1 Inhaler 3  . methocarbamol (ROBAXIN) 750 MG tablet Take 750 mg by mouth 4 (four) times daily as needed (for muscle spasms).    . montelukast (SINGULAIR) 10 MG tablet Take 1 tablet (10 mg total) by mouth at bedtime. 30 tablet 3  . Spacer/Aero-Holding Chambers (AEROCHAMBER Z-STAT PLUS CHAMBR) MISC Use as directed 1 each 0  . levalbuterol (XOPENEX) 0.63 MG/3ML nebulizer solution Take 3 mLs (0.63 mg total) by nebulization once. 3 mL 12   No current facility-administered medications on file prior to visit.     Allergies  Allergen Reactions  . Hydrocodone Hives  . Iodine Hives  . Prednisone     Rapid heartrate  . Tincture Of Benzoin [Benzoin]     Blister    Objective:  BP (!) 160/98 (BP Location: Right Arm, Patient Position: Sitting, Cuff Size: Normal)   Pulse 93   Temp 98.9 F (37.2 C) (Oral)   Resp 18   Ht 5\' 6"  (1.676 m)   Wt 172 lb (78 kg)   SpO2 98%   BMI 27.76 kg/m   Physical Exam  Constitutional: She is oriented to person, place, and time and well-developed, well-nourished, and in no distress.  HENT:  Head: Normocephalic and atraumatic.  Eyes: Conjunctivae are normal.  Neck: Trachea normal and normal range of motion. No thyroid mass and no thyromegaly present.  Cardiovascular: Normal rate, regular rhythm and normal heart sounds.   Pulmonary/Chest: Effort normal and breath sounds normal. She has no wheezes. She has no rales.  Lymphadenopathy:       Head (right side): No submental, no submandibular, no tonsillar, no preauricular, no posterior auricular and no occipital adenopathy present.       Head (left side): No submental, no submandibular, no tonsillar, no preauricular, no posterior auricular and no  occipital adenopathy present.    She has no cervical adenopathy.       Right: No supraclavicular adenopathy present.       Left: No supraclavicular adenopathy present.  Neurological: She is alert and oriented to person, place, and time. She has normal reflexes. Gait normal.  Skin: Skin is warm and dry.  Psychiatric: Affect normal.  Vitals reviewed.  Wt Readings from Last 3 Encounters:  10/19/16 172 lb (78 kg)  09/18/16 172 lb 12.8 oz (78.4 kg)  08/06/16 169 lb (76.7 kg)   BP Readings from Last 3 Encounters:  10/19/16 (!) 160/98  09/18/16 (!) 142/84  08/06/16 120/80   Assessment and Plan :  1. Thyroid nodule - TSH - T4, Free - T3, Free - Ambulatory referral to Endocrinology  2. Elevated blood pressure reading -Per pt, her bp is always elevated in the doctors office but she is also anxious about the thyroid nodule today. She notes her bp readings out of office are in [id number] systolic range. We will continue to follow this.   Tenna Delaine PA-C  [facility] 10/19/2016 6:02 PM

## 2016-10-19 NOTE — Patient Instructions (Addendum)
We will contact you with lab results. You should hear from endocrinology within 2 weeks.  Thank you for letting me participate in your health and well being.   IF you received an x-ray today, you will receive an invoice from [facility]. Please contact [facility] at [phone] with questions or concerns regarding your invoice.   IF you received labwork today, you will receive an invoice from [employer]. Please contact [facility] at [phone] with questions or concerns regarding your invoice.   Our billing staff will not be able to assist you with questions regarding bills from these companies.  You will be contacted with the lab results as soon as they are available. The fastest way to get your results is to activate your My Chart account. Instructions are located on the last page of this paperwork. If you have not heard from [place] regarding the results in 2 weeks, please contact this office.

## 2016-10-20 LAB — TSH: TSH: 1.55 m[IU]/L

## 2016-10-20 LAB — T4, FREE: FREE T4: 1.1 ng/dL (ref 0.8–1.8)

## 2016-10-20 LAB — T3, FREE: T3, Free: 3.2 pg/mL (ref 2.3–4.2)

## 2016-10-31 ENCOUNTER — Ambulatory Visit (INDEPENDENT_AMBULATORY_CARE_PROVIDER_SITE_OTHER): Payer: 59 | Admitting: Endocrinology

## 2016-10-31 ENCOUNTER — Encounter: Payer: Self-pay | Admitting: Endocrinology

## 2016-10-31 DIAGNOSIS — E041 Nontoxic single thyroid nodule: Secondary | ICD-10-CM

## 2016-10-31 NOTE — Patient Instructions (Addendum)
Let's check the thyroid ultrasound.  you will receive a phone call, about a day and time for an appointment.   You are at risk for an over or underactive thyroid in the future, so you should have your blood tests checked at least each year.   I would be happy to see you back here as needed.

## 2016-10-31 NOTE — Progress Notes (Signed)
Subjective:    Patient ID: Christine Miranda, female    DOB: Jul 28, 1971, 45 y.o.   MRN: [id number]  HPI Pt is ref by Leonie Douglas, PA-C, for thyroid nodule.  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  She says she had episodes of both hypo- and hyperthyroidism, then, but TFT have been normal off rx since  approx the late-1990's.  Pt was incidentally noted to have a nodule at the thyroid on a CT in Aug of 2017.  she has no h/o XRT or surgery to the neck.  She has moderate weight gain, and assoc fatigue.   Past Medical History:  Diagnosis Date  . Allergic rhinitis   . Asthma   . Lower half migraine   . Pelvic fracture [facility])     Past Surgical History:  Procedure Laterality Date  . ABDOMINAL HYSTERECTOMY    . arm surgery    . CHOLECYSTECTOMY    . ELBOW SURGERY    . HAND SURGERY    . KNEE SURGERY     x 2  . OVARY SURGERY    . PELVIC SYMPHYSIS FUSION    . SPINAL CORD STIMULATOR IMPLANT      Social History   Social History  . Marital status: Married    Spouse name: N/A  . Number of children: N/A  . Years of education: N/A   Occupational History  . Not on file.   Social History Main Topics  . Smoking status: Passive Smoke Exposure - Never Smoker  . Smokeless tobacco: Never Used     Comment: Stepfather for 5 years   . Alcohol use 0.6 - 1.2 oz/week    1 - 2 Glasses of wine per week  . Drug use: No  . Sexual activity: Not on file   Other Topics Concern  . Not on file   Social History Narrative   Originally from [place], [place]. Has lived in [place], [place], [place], [place]. Moved to [place] in 1996. She has 2 [employer]. She does have some exposure to cleaning chemical fumes. Has a cat. No bird, mold, or hot tub exposure. Enjoys shopping.     Current Outpatient Prescriptions on File Prior to Visit  Medication Sig Dispense Refill  . budesonide-formoterol (SYMBICORT) 160-4.5 MCG/ACT inhaler Inhale 2 puffs into the lungs 2 (two) times daily.  1 Inhaler 6  . cetirizine (ZYRTEC) 10 MG tablet Take 1 tablet (10 mg total) by mouth daily. 30 tablet 11  . fluticasone (FLONASE) 50 MCG/ACT nasal spray Place 2 sprays into both nostrils daily. 16 g 6  . levalbuterol (XOPENEX HFA) 45 MCG/ACT inhaler Inhale 1-2 puffs into the lungs every 4 (four) hours as needed for wheezing. 1 Inhaler 3  . methocarbamol (ROBAXIN) 750 MG tablet Take 750 mg by mouth 4 (four) times daily as needed (for muscle spasms).    . montelukast (SINGULAIR) 10 MG tablet Take 1 tablet (10 mg total) by mouth at bedtime. 30 tablet 3  . Spacer/Aero-Holding Chambers (AEROCHAMBER Z-STAT PLUS CHAMBR) MISC Use as directed 1 each 0  . levalbuterol (XOPENEX) 0.63 MG/3ML nebulizer solution Take 3 mLs (0.63 mg total) by nebulization once. 3 mL 12   No current facility-administered medications on file prior to visit.     Allergies  Allergen Reactions  . Hydrocodone Hives  . Iodine Hives  . Prednisone     Rapid heartrate  . Tincture Of Benzoin [Benzoin]     Blister  Family History  Problem Relation Age of Onset  . Hyperlipidemia Mother   . Hypertension Mother   . Rheumatologic disease Mother   . Hyperlipidemia Father   . Deep vein thrombosis Father     Provoked  . Pulmonary embolism Father     Provoked  . Asthma Son   . Arthritis Maternal Aunt   . Asthma Maternal Grandmother   . Diabetes Maternal Grandmother     BP (!) 158/96   Pulse (!) 108   Ht 5\' 6"  (1.676 m)   Wt 174 lb (78.9 kg)   SpO2 97%   BMI 28.08 kg/m    Review of Systems Denies fever, neck pain, visual loss, chest pain, dysphagia, numbness, and rhinorrhea.  She has hair loss, easy bruising, cold intolerance, headache, and intermittent hoarseness.  Depression is improved recently.  She has intermittent sob, due to asthma.  She has intermittent rash, due to psoriasis.      Objective:   Physical Exam VS: see vs page GEN: no distress HEAD: head: no deformity eyes: no periorbital swelling, no  proptosis external nose and ears are normal mouth: no lesion seen NECK: supple, thyroid is not enlarged.  The nodule is non-palpable. CHEST WALL: no deformity LUNGS: clear to auscultation CV: reg rate and rhythm, no murmur ABD: abdomen is soft, nontender.  no hepatosplenomegaly.  not distended.  no hernia MUSCULOSKELETAL: muscle bulk and strength are grossly normal.  no obvious joint swelling.  gait is normal and steady EXTEMITIES: no deformity.  no ulcer on the feet.  feet are of normal color and temp.  no edema PULSES: dorsalis pedis intact bilat.  no carotid bruit NEURO:  cn 2-12 grossly intact.   readily moves all 4's.  sensation is intact to touch on the feet SKIN:  Normal texture and temperature.  No rash or suspicious lesion is visible.   NODES:  None palpable at the neck PSYCH: alert, well-oriented.  Does not appear anxious nor depressed.   Lab Results  Component Value Date   TSH 1.55 10/19/2016   Chest CT: Tiny right thyroid nodule measuring less than 1 cm.  I have reviewed outside records, and summarized: Pt was noted to have thyroid nodule, and referred here. The nodule was incidentally noted on CT, done for cough.  She also was noted to have elev BP.       Assessment & Plan:  H/o abnormal thyroid function, new to me.  She is at risk for recurrent abnormal function. Thyroid nodule, uncertain etiology.   Euthyroid.    Patient is advised the following: Patient Instructions  Let's check the thyroid ultrasound.  you will receive a phone call, about a day and time for an appointment.   You are at risk for an over or underactive thyroid in the future, so you should have your blood tests checked at least each year.   I would be happy to see you back here as needed.

## 2016-11-09 ENCOUNTER — Ambulatory Visit
Admission: RE | Admit: 2016-11-09 | Discharge: 2016-11-09 | Disposition: A | Discharge status: Home / Self Care | Payer: 59 | Source: Ambulatory Visit | Attending: Endocrinology | Admitting: Endocrinology

## 2016-11-09 DIAGNOSIS — E041 Nontoxic single thyroid nodule: Secondary | ICD-10-CM

## 2016-11-14 ENCOUNTER — Ambulatory Visit (HOSPITAL_COMMUNITY)
Admission: RE | Admit: 2016-11-14 | Discharge: 2016-11-14 | Disposition: A | Discharge status: Home / Self Care | Payer: 59 | Source: Ambulatory Visit | Attending: Pulmonary Disease | Admitting: Pulmonary Disease

## 2016-11-14 ENCOUNTER — Encounter: Payer: Self-pay | Admitting: Pulmonary Disease

## 2016-11-14 ENCOUNTER — Ambulatory Visit (INDEPENDENT_AMBULATORY_CARE_PROVIDER_SITE_OTHER): Payer: 59 | Admitting: Pulmonary Disease

## 2016-11-14 VITALS — BP 162/102 | HR 106 | Ht 66.0 in | Wt 170.0 lb

## 2016-11-14 DIAGNOSIS — Z23 Encounter for immunization: Secondary | ICD-10-CM | POA: Diagnosis not present

## 2016-11-14 DIAGNOSIS — J302 Other seasonal allergic rhinitis: Secondary | ICD-10-CM

## 2016-11-14 DIAGNOSIS — J4550 Severe persistent asthma, uncomplicated: Secondary | ICD-10-CM

## 2016-11-14 LAB — PULMONARY FUNCTION TEST
DL/VA % pred: 121 %
DL/VA: 6.14 ml/min/mmHg/L
DLCO UNC % PRED: 91 %
DLCO unc: 24.58 ml/min/mmHg
FEF 25-75 Post: 4.37 L/sec
FEF 25-75 Pre: 3.6 L/sec
FEF2575-%CHANGE-POST: 21 %
FEF2575-%PRED-POST: 145 %
FEF2575-%Pred-Pre: 119 %
FEV1-%CHANGE-POST: 4 %
FEV1-%PRED-POST: 90 %
FEV1-%Pred-Pre: 86 %
FEV1-POST: 2.79 L
FEV1-PRE: 2.66 L
FEV1FVC-%CHANGE-POST: 2 %
FEV1FVC-%Pred-Pre: 107 %
FEV6-%CHANGE-POST: 3 %
FEV6-%PRED-PRE: 80 %
FEV6-%Pred-Post: 82 %
FEV6-PRE: 3.03 L
FEV6-Post: 3.12 L
FEV6FVC-%Change-Post: 0 %
FEV6FVC-%PRED-PRE: 101 %
FEV6FVC-%Pred-Post: 102 %
FVC-%CHANGE-POST: 2 %
FVC-%PRED-POST: 81 %
FVC-%PRED-PRE: 79 %
FVC-POST: 3.12 L
FVC-PRE: 3.04 L
POST FEV1/FVC RATIO: 89 %
POST FEV6/FVC RATIO: 100 %
PRE FEV6/FVC RATIO: 100 %
Pre FEV1/FVC ratio: 87 %
RV % PRED: 90 %
RV: 1.64 L
TLC % pred: 87 %
TLC: 4.69 L

## 2016-11-14 MED ORDER — ALBUTEROL SULFATE (2.5 MG/3ML) 0.083% IN NEBU
2.5000 mg | INHALATION_SOLUTION | Freq: Once | RESPIRATORY_TRACT | Status: AC
  Administered 2016-11-14: 2.5 mg via RESPIRATORY_TRACT

## 2016-11-14 NOTE — Addendum Note (Signed)
Addended by: Desmond Dike C on: 11/14/2016 01:42 PM   Modules accepted: Orders

## 2016-11-14 NOTE — Patient Instructions (Signed)
   Continue using your inhalers and medications as prescribed.  We are referring you to an allergist to be scheduled at your convenience.  Call me if you cough gets worse or you have any new breathing problems before your next appointment.  I will see you back in 3 months or sooner if needed.

## 2016-11-14 NOTE — Progress Notes (Signed)
Subjective:    Patient ID: Christine Miranda, female    DOB: Nov 03, 1971, 45 y.o.   MRN: [id number]  C.C.:  Follow-up for Severe, Persistent Asthma & Chronic Seasonal Allergic Rhinitis.  HPI Severe, Persistent Asthma:  Switched to Symbicort 160/4.5 with spacer at last appointment. She reports her cough has improved somewhat. She reports her cough remains rarely productive. No wheezing. She reports she is no longer awaking at night with dyspnea. She reports she has still intermittently been using her Xopenex inhaler. No exacerbations since last appointment.   Chronic Seasonal Allergic Rhinitis:  Prescribed Zyrtec, Singulair, & Flonase. She reports she has had more itching in her eyes with dryness and sneezing. Continued post-nasal drainage.   Review of Systems No fever or chills. Does have intermittent sweats. No chest pressure. Does have burning chest pain & tightness with her coughing spells. No reflux or dyspepsia. No morning brash water taste.   Allergies  Allergen Reactions  . Hydrocodone Hives  . Iodine Hives  . Prednisone     Rapid heartrate  . Tincture Of Benzoin [Benzoin]     Blister    Current Outpatient Prescriptions on File Prior to Visit  Medication Sig Dispense Refill  . budesonide-formoterol (SYMBICORT) 160-4.5 MCG/ACT inhaler Inhale 2 puffs into the lungs 2 (two) times daily. 1 Inhaler 6  . cetirizine (ZYRTEC) 10 MG tablet Take 1 tablet (10 mg total) by mouth daily. 30 tablet 11  . fluticasone (FLONASE) 50 MCG/ACT nasal spray Place 2 sprays into both nostrils daily. 16 g 6  . levalbuterol (XOPENEX HFA) 45 MCG/ACT inhaler Inhale 1-2 puffs into the lungs every 4 (four) hours as needed for wheezing. 1 Inhaler 3  . levalbuterol (XOPENEX) 0.63 MG/3ML nebulizer solution Take 3 mLs (0.63 mg total) by nebulization once. 3 mL 12  . methocarbamol (ROBAXIN) 750 MG tablet Take 750 mg by mouth 4 (four) times daily as needed (for muscle spasms).    . montelukast (SINGULAIR) 10 MG tablet  Take 1 tablet (10 mg total) by mouth at bedtime. 30 tablet 3  . Spacer/Aero-Holding Chambers (AEROCHAMBER Z-STAT PLUS CHAMBR) MISC Use as directed 1 each 0   No current facility-administered medications on file prior to visit.     Past Medical History:  Diagnosis Date  . Allergic rhinitis   . Asthma   . Lower half migraine   . Pelvic fracture [facility])     Past Surgical History:  Procedure Laterality Date  . ABDOMINAL HYSTERECTOMY    . arm surgery    . CHOLECYSTECTOMY    . ELBOW SURGERY    . HAND SURGERY    . KNEE SURGERY     x 2  . OVARY SURGERY    . PELVIC SYMPHYSIS FUSION    . SPINAL CORD STIMULATOR IMPLANT      Family History  Problem Relation Age of Onset  . Hyperlipidemia Mother   . Hypertension Mother   . Rheumatologic disease Mother   . Hyperlipidemia Father   . Deep vein thrombosis Father     Provoked  . Pulmonary embolism Father     Provoked  . Asthma Son   . Arthritis Maternal Aunt   . Asthma Maternal Grandmother   . Diabetes Maternal Grandmother     Social History   Social History  . Marital status: Married    Spouse name: N/A  . Number of children: N/A  . Years of education: N/A   Social History Main Topics  . Smoking status: Former  Smoker    Packs/day: 0.50    Years: 5.00  . Smokeless tobacco: Never Used     Comment: Stepfather for 5 years   . Alcohol use 0.6 - 1.2 oz/week    1 - 2 Glasses of wine per week  . Drug use: No  . Sexual activity: Not Asked   Other Topics Concern  . None   Social History Narrative   Originally from [place], [place]. Has lived in [place], [place], [place], [place]. Moved to [place] in 1996. She has 2 [employer]. She does have some exposure to cleaning chemical fumes. Has a cat. No bird, mold, or hot tub exposure. Enjoys shopping.       Objective:   Physical Exam BP (!) 162/102 (BP Location: Right Arm, Cuff Size: Normal)   Pulse (!) 106   Ht 5\' 6"  (1.676 m)   Wt 170 lb (77.1 kg)   SpO2 98%   BMI 27.44  kg/m  General:  Awake. No distress. Comfortable. Integument:  Warm & dry. No rash on exposed skin.  Lymphatics:  No appreciated cervical or supraclavicular lymphadenoapthy. HEENT:  Moist mucus membranes. No scleral icterus. Mild right nasal turbinate swelling. Cardiovascular:  Regular rate and rhythm. Normal S1 & S2.  Pulmonary:  Clear with auscultation bilaterally. Speaking in complete sentences. Normal work of breathing on room air. Abdomen: Soft. Normal bowel sounds. Nontender.  PFT 11/14/16:FVC 3.04 L (79%) FEV1 2.66 L (86%) FEV1/FVC 0.87 FEF 25-75 3.60 L (119%) negative bronchodilator response TLC 4.69 L (87%) RV 90% ERV 40% DLCO uncorrected 91%  IMAGING CT CHEST W/O 10/17/16 (personally reviewed by me): 1 cm hypodense right thyroid nodule. No pathologic mediastinal adenopathy. No pericardial effusion. No pleural effusion or thickening. No parenchymal nodule or opacity.  CXR PA/LAT 07/30/16 (previously reviewed by me): No focal opacity, mass, or nodule appreciated. No pleural effusion appreciated. Heart normal in size & mediastinum normal in contour.  LABS 09/18/16 CBC: 8.4/15.0/43.1/307 Eosinophils: 0.1 IgE: 7 RAST Panel:  [place] grass 0.15 / [place] Grass 0.22 / [place] Grass 1.03  07/30/16 CBC: 9.2/15.0/41.3/291 D dimer: 0.25    Assessment & Plan:  45 y.o. female with severe, persistent asthma & chronic seasonal allergic rhinitis. The patient's chest CT was reviewed with her today as well as her serum lab tests. Her pulmonary function testing shows no evidence of fixed airway obstruction or significant bronchodilator response which is encouraging. Patient's nocturnal coughing and dyspnea seemed to have remarkably improved and resolved on Symbicort therapy. Therefore, I am holding off on initiating additional medications at this time. She may benefit from immunotherapy with her RAST panel sensitivities to grasses. I instructed the patient contact my office if she had any new  breathing problems or questions before her next appointment.  1. Severe, Persistent Asthma: Continuing Symbicort. No changes at this time. 2. Chronic Seasonal Allergic Rhinitis: Suboptimally controlled. Appears to have sensitivities that may respond to immunotherapy. Referring to allergy/immunology. Continuing Zyrtec, Singulair, and Flonase for now. 3. Health Maintenance: Administering influenza vaccine today. 4. Follow-up: Patient to return to clinic in 3 months or sooner if needed.  Sonia Baller Ashok Cordia, M.D. [facility] Pager:  [phone] After 3pm or if no response, call [phone] 12:17 PM 11/14/16

## 2016-11-19 ENCOUNTER — Encounter: Payer: Self-pay | Admitting: Endocrinology

## 2016-11-19 ENCOUNTER — Other Ambulatory Visit: Payer: Self-pay | Admitting: Endocrinology

## 2016-11-19 DIAGNOSIS — E041 Nontoxic single thyroid nodule: Secondary | ICD-10-CM

## 2016-11-26 NOTE — Telephone Encounter (Signed)
Patient stated no one has called her concerning her ultra sound..please advise

## 2016-11-26 NOTE — Telephone Encounter (Signed)
I contacted [facility] and scheduled her thyroid biopsy for 11/28/2016. Patient has recently had her US done and did not need this scheduled at this time. Biopsy has been scheduled for 11/28/2016 at 330 patient advised to arrive by 310. Location will be [facility] on [address]. Patient was given the address and phone number for the location and was advised to call them if she needed to reschedule. Pateint voiced understanding and had no further questions at this time.

## 2016-11-28 ENCOUNTER — Other Ambulatory Visit (HOSPITAL_COMMUNITY)
Admission: RE | Admit: 2016-11-28 | Discharge: 2016-11-28 | Disposition: A | Discharge status: Home / Self Care | Payer: 59 | Source: Ambulatory Visit | Attending: Radiology | Admitting: Radiology

## 2016-11-28 ENCOUNTER — Ambulatory Visit
Admission: RE | Admit: 2016-11-28 | Discharge: 2016-11-28 | Disposition: A | Discharge status: Home / Self Care | Payer: 59 | Source: Ambulatory Visit | Attending: Endocrinology | Admitting: Endocrinology

## 2016-11-28 DIAGNOSIS — E041 Nontoxic single thyroid nodule: Secondary | ICD-10-CM | POA: Insufficient documentation

## 2016-12-05 ENCOUNTER — Other Ambulatory Visit: Payer: Self-pay | Admitting: Physician Assistant

## 2016-12-05 DIAGNOSIS — R05 Cough: Secondary | ICD-10-CM

## 2016-12-05 DIAGNOSIS — R059 Cough, unspecified: Secondary | ICD-10-CM

## 2016-12-05 NOTE — Telephone Encounter (Signed)
Last seen 07/2016

## 2016-12-12 ENCOUNTER — Ambulatory Visit (INDEPENDENT_AMBULATORY_CARE_PROVIDER_SITE_OTHER): Payer: 59 | Admitting: Allergy & Immunology

## 2016-12-12 ENCOUNTER — Encounter: Payer: Self-pay | Admitting: Allergy & Immunology

## 2016-12-12 VITALS — BP 136/80 | HR 90 | Temp 98.2°F | Resp 20 | Ht 64.25 in | Wt 176.6 lb

## 2016-12-12 DIAGNOSIS — J3089 Other allergic rhinitis: Secondary | ICD-10-CM | POA: Diagnosis not present

## 2016-12-12 DIAGNOSIS — J4540 Moderate persistent asthma, uncomplicated: Secondary | ICD-10-CM

## 2016-12-12 DIAGNOSIS — L253 Unspecified contact dermatitis due to other chemical products: Secondary | ICD-10-CM

## 2016-12-12 MED ORDER — AZELASTINE-FLUTICASONE 137-50 MCG/ACT NA SUSP: 2.0000 | Freq: Two times a day (BID) | NASAL | 4 refills | Status: DC

## 2016-12-12 NOTE — Patient Instructions (Addendum)
1. Moderate persistent asthma, uncomplicated - Lung testing looked normal today. - We will not change any medications at this time since you are being followed by Dr. Ashok Cordia.  - Daily controller medication(s): Symbicort 160/4.5 two puffs twice daily with spacer - Rescue medications: ProAir 4 puffs every 4-6 hours as needed - Asthma control goals:  * Full participation in all desired activities (may need albuterol before activity) * Albuterol use two time or less a week on average (not counting use with activity) * Cough interfering with sleep two time or less a month * Oral steroids no more than once a year * No hospitalizations  2. Chronic allergic rhinitis - Testing today was positive to grasses, weeds, ragweed, molds, dog, cockroach - Stop Flonase and start Dymista 2 sprays per nostril 1-2 times daily (sample provided) - Immunotherapy discussed and Consent was signed.  3. Contact dermatitis due to chemicals - We will place patch testing next Monday with readings on Wednesday and Friday.  4. Return in about 5 days (around 12/17/2016).  Please inform us of any Emergency Department visits, hospitalizations, or changes in symptoms. Call us before going to the ED for breathing or allergy symptoms since we might be able to fit you in for a sick visit. Feel free to contact us anytime with any questions, problems, or concerns.  It was a pleasure to meet you today! Have a wonderful holiday season!   Websites that have reliable patient information: 1. American Academy of Asthma, Allergy, and Immunology: www.aaaai.org 2. Food Allergy Research and Education (FARE): foodallergy.org 3. Mothers of Asthmatics: http://www.asthmacommunitynetwork.org 4. American College of Allergy, Asthma, and Immunology: www.acaai.org  Reducing Pollen Exposure  The American Academy of Allergy, Asthma and Immunology suggests the following steps to reduce your exposure to pollen during allergy seasons.    1. Do  not hang sheets or clothing out to dry; pollen may collect on these items. 2. Do not mow lawns or spend time around freshly cut grass; mowing stirs up pollen. 3. Keep windows closed at night.  Keep car windows closed while driving. 4. Minimize morning activities outdoors, a time when pollen counts are usually at their highest. 5. Stay indoors as much as possible when pollen counts or humidity is high and on windy days when pollen tends to remain in the air longer. 6. Use air conditioning when possible.  Many air conditioners have filters that trap the pollen spores. 7. Use a HEPA room air filter to remove pollen form the indoor air you breathe.  Control of Dog or Cat Allergen  Avoidance is the best way to manage a dog or cat allergy. If you have a dog or cat and are allergic to dog or cats, consider removing the dog or cat from the home. If you have a dog or cat but don't want to find it a new home, or if your family wants a pet even though someone in the household is allergic, here are some strategies that may help keep symptoms at bay:  1. Keep the pet out of your bedroom and restrict it to only a few rooms. Be advised that keeping the dog or cat in only one room will not limit the allergens to that room. 2. Don't pet, hug or kiss the dog or cat; if you do, wash your hands with soap and water. 3. High-efficiency particulate air (HEPA) cleaners run continuously in a bedroom or living room can reduce allergen levels over time. 4. Regular use of a high-efficiency  vacuum cleaner or a central vacuum can reduce allergen levels. 5. Giving your dog or cat a bath at least once a week can reduce airborne allergen.  Control of Cockroach Allergen  Cockroach allergen has been identified as an important cause of acute attacks of asthma, especially in urban settings.  There are fifty-five species of cockroach that exist in the [place], however only three, the [place], [place] species  produce allergen that can affect patients with Asthma.  Allergens can be obtained from fecal particles, egg casings and secretions from cockroaches.    1. Remove food sources. 2. Reduce access to water. 3. Seal access and entry points. 4. Spray runways with 0.5-1% Diazinon or Chlorpyrifos 5. Blow boric acid power under stoves and refrigerator. 6. Place bait stations (hydramethylnon) at feeding sites.  Control of Mold Allergen  Mold and fungi can grow on a variety of surfaces provided certain temperature and moisture conditions exist.  Outdoor molds grow on plants, decaying vegetation and soil.  The major outdoor mold, Alternaria and Cladosporium, are found in very high numbers during hot and dry conditions.  Generally, a late Summer - Fall peak is seen for common outdoor fungal spores.  Rain will temporarily lower outdoor mold spore count, but counts rise rapidly when the rainy period ends.  The most important indoor molds are Aspergillus and Penicillium.  Dark, humid and poorly ventilated basements are ideal sites for mold growth.  The next most common sites of mold growth are the bathroom and the kitchen.  Outdoor [employer] 1. Use air conditioning and keep windows closed 2. Avoid exposure to decaying vegetation. 3. Avoid leaf raking. 4. Avoid grain handling. 5. Consider wearing a face mask if working in moldy areas.  Indoor Mold Control 1. Maintain humidity below 50%. 2. Clean washable surfaces with 5% bleach solution. 3. Remove sources e.g. contaminated carpets.

## 2016-12-12 NOTE — Progress Notes (Addendum)
NEW PATIENT  Date of Service/Encounter:  12/12/16   Assessment:   Moderate persistent asthma, uncomplicated  Chronic nonseasonal allergic rhinitis due to pollen  Contact dermatitis due to chemicals   Psoriasis - will be starting Humira soon   Asthma Reportables:  Severity: moderate persistent  Risk: high Control: not well controlled  Seasonal Influenza Vaccine: yes    Plan/Recommendations:   1. Moderate persistent asthma, uncomplicated - Lung testing looked normal today. - We will not change any medications at this time since you are being followed by Dr. Ashok Cordia.  - Unfortunately at this time, she would not qualify for our current biologics that we use for asthma (Xolair would not be approved with her IgE being only 7 and the anti-IL5 agents would not be approved with her AEC being under 150). - I will route this note to our biologics point person - Tammy - for her opinion. - I did discuss doing a workup for causes of uncontrolled asthma - namely alpha 1 anti-trypsin, ABPA, or hypomorphic CF mutations - however I think we can hold off on this given the normal chest CT which would likely demonstrate emphysematous changes or bronchiectasis with any of those etiologies. - Daily controller medication(s): Symbicort 160/4.5 two puffs twice daily with spacer - Rescue medications: ProAir 4 puffs every 4-6 hours as needed - Asthma control goals:  * Full participation in all desired activities (may need albuterol before activity) * Albuterol use two time or less a week on average (not counting use with activity) * Cough interfering with sleep two time or less a month * Oral steroids no more than once a year * No hospitalizations  2. Chronic allergic rhinitis - with a mild reaction to skin testing (markedly increased coughing) - Testing today was positive to grasses, weeds, ragweed, molds, dog, cockroach - Stop Flonase and start Dymista 2 sprays per nostril 1-2 times daily (sample  provided) - Immunotherapy discussed and Consent was signed. - We did give albuterol as well as cetirizine 98m following the administration of the intradermal testing.   3. Contact dermatitis due to chemicals - We will place patch testing next Monday with readings on Wednesday and Friday.  - This will help u[place]oreato determine the triggers for her contact dermatitis and provide avoidance measures.   4. Return in about 5 days (around 12/17/2016).   Subjective:   Christine SCHERMERHORNis a 45y.o. female presenting today for evaluation of  Chief Complaint  Patient presents with  . Asthma    Wants to get asthma under control  . Allergy Testing    everything  . Cough    since May 2017, productive cough comes and goes  . Nasal Congestion    mild  .  Christine Buntinghas a history of the following: Patient Active Problem List   Diagnosis Date Noted  . Thyroid nodule 10/31/2016  . Asthma 09/18/2016  . Allergic rhinitis 09/18/2016  . Chronic back pain 07/30/2016  . Migraines 07/30/2016    History obtained from: chart review and patient.  Christine Miranda referred by BLeonie Douglas PA-C.     TIcisis a 45y.o. female presenting for an allergy evaluation. She has a history of chronic rhinitis that is worse in the spring, summer, and fall. She has been on Flonase and cetirizine without help. She has been on Claritin in the past for years but this did not seem to work this year. She has not been  on Xyzal otherwise she has been on all of the antihistamines at some point. She has never been on Dymista. She endorses hay fever throughout the year but it typically gets better in the winter. There is a cat at home. Cat does sleep in the bed with the patient but she does not pet the cat. She tends to do OK until she touches her hands to her face. She does OK as long as she does not touch her face. Her illnesses consist of "chronic bronchitis" and she is never treated with antibiotics. She has been treated  with antibiotics but it never helped at all. She has been treated twice with antibiotics without resolution. She has never had a sinus CT.   Christine Miranda does have a history of asthma. She has had this since she was around 45 years of age. Symptoms seem to be seasonal with worsening symptoms in the spring and the fall. She has nighttime awakenings 1-7 nights per week. She has been on multiple medications in the past including Qvar, albuterol, and Advair. Singulair has also been tried without relief. She did have an allergy serum specific IgE panel sent in September which was notable for elevated levels to [place], Timothy, and Johnson grass.  Christine Miranda is followed by Dr. Creig Hines in pulmonology. Her last visit with him was in September 2017. At that time, she was placed on Symbicort 160/4.52 puffs in the morning and 2 puffs at night with spacer. At that visit, he did send a complete blood count which was notable for an absolute eosinophil count of 100. He also sent the serum specific IgE environmental allergen panel. A chest CT was notable for a tiny right thyroid nodule, but was otherwise normal. Biopsy of this nodule was normal, but she has a history of thyroiditis. She does not feel that she has done well with the Symbicort. She is not coughing as much and estimates that she is 50% better. Next visit with Dr. Creig Hines in February 2017.   She was allergic to shellfish but can eat them now. [name] does have a history of hives that appear intermittently two times per year. She is unsure of the triggers. She has a history of psoriasis and was on [place]. However, she developed abdominal pain with this therefore she is being changed to Humira instead. Otherwise, there is no history of other atopic diseases, including, drug allergies, food allergies, or stinging insect allergies. There is no significant infectious history. Vaccinations are up to date.    Past Medical History: Patient Active Problem List   Diagnosis  Date Noted  . Thyroid nodule 10/31/2016  . Asthma 09/18/2016  . Allergic rhinitis 09/18/2016  . Chronic back pain 07/30/2016  . Migraines 07/30/2016    Medication List:    Medication List       Accurate as of 12/12/16 11:51 AM. Always use your most recent med list.          AEROCHAMBER Z-STAT PLUS CHAMBR Misc Use as directed   budesonide-formoterol 160-4.5 MCG/ACT inhaler Commonly known as:  SYMBICORT Inhale 2 puffs into the lungs 2 (two) times daily.   cetirizine 10 MG tablet Commonly known as:  ZYRTEC Take 1 tablet (10 mg total) by mouth daily.   fluticasone 50 MCG/ACT nasal spray Commonly known as:  FLONASE Place 2 sprays into both nostrils daily.   levalbuterol 0.63 MG/3ML nebulizer solution Commonly known as:  XOPENEX Take 3 mLs (0.63 mg total) by nebulization once.   levalbuterol 45 MCG/ACT  inhaler Commonly known as:  XOPENEX HFA Inhale 1-2 puffs into the lungs every 4 (four) hours as needed for wheezing.   methocarbamol 750 MG tablet Commonly known as:  ROBAXIN Take 750 mg by mouth 4 (four) times daily as needed (for muscle spasms).   montelukast 10 MG tablet Commonly known as:  SINGULAIR TAKE 1 TABLET (10 MG TOTAL) BY MOUTH AT BEDTIME.       Birth History: non-contributory. Born at term without complications.   Developmental History: Christine Miranda has met all milestones on time. She has required no speech therapy, occupational therapy, or physical therapy.   Past Surgical History: Past Surgical History:  Procedure Laterality Date  . ABDOMINAL HYSTERECTOMY    . arm surgery    . CHOLECYSTECTOMY    . ELBOW SURGERY    . HAND SURGERY    . KNEE SURGERY     x 2  . OVARY SURGERY    . PELVIC SYMPHYSIS FUSION    . SPINAL CORD STIMULATOR IMPLANT       Family History: Family History  Problem Relation Age of Onset  . Hyperlipidemia Mother   . Hypertension Mother   . Rheumatologic disease Mother   . Hyperlipidemia Father   . Deep vein thrombosis Father      Provoked  . Pulmonary embolism Father     Provoked  . Asthma Son   . Eczema Son   . Arthritis Maternal Aunt   . Asthma Maternal Grandmother   . Diabetes Maternal Grandmother   . Allergic rhinitis Neg Hx   . Angioedema Neg Hx   . Immunodeficiency Neg Hx   . Urticaria Neg Hx       Social History: Leanora lives at home with her husband. She has 3 children, who are all outside of the home. She has 3 grandchildren. She lives in a house that is 46 years old. There is carpeting throughout the home. There is no mildew roach problems in the home. There are cats and dogs outside of the home as well as one Inside of the. They have electric heating and central cooling. There is no tobacco exposure. They do not use dust mite covers on their bedding.  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 She grew up in the [place] and lived in [place] and [place]; she had similar symptoms in all of these areas.    Review of Systems: a 14-point review of systems is pertinent for what is mentioned in HPI.  Otherwise, all other systems were negative. Constitutional: negative other than that listed in the HPI Eyes: negative other than that listed in the HPI Ears, nose, mouth, throat, and face: negative other than that listed in the HPI Respiratory: negative other than that listed in the HPI Cardiovascular: negative other than that listed in the HPI Gastrointestinal: negative other than that listed in the HPI Genitourinary: negative other than that listed in the HPI Integument: negative other than that listed in the HPI Hematologic: negative other than that listed in the HPI Musculoskeletal: negative other than that listed in the HPI Neurological: negative other than that listed in the HPI Allergy/Immunologic: negative other than that listed in the HPI    Objective:   Blood pressure 136/80, pulse 90, temperature 98.2 F (36.8 C), temperature source Oral, resp. rate 20, height 5' 4.25" (1.632 m), weight 176 lb 9.6 oz  (80.1 kg), SpO2 97 %. Body mass index is 30.08 kg/m.   Physical Exam:  General: Alert, interactive, in no acute distress. Cooperative  with the exam. Excellent sense of humor. Eyes: No conjunctival injection present on the right, No conjunctival injection present on the left, PERRL bilaterally, No discharge on the right and No discharge on the left Ears: Right TM pearly gray with normal light reflex, Left TM pearly gray with normal light reflex, Right TM intact without perforation and Left TM intact without perforation.  Nose/Throat: External nose within normal limits, nasal crease present and septum midline, turbinates edematous and pale with clear discharge, possible nasal polyp in the right (although on re-examination it appeared to be part of the inferior turbinate), post-pharynx erythematous with cobblestoning in the posterior oropharynx. Tonsils 3+ without exudates Neck: Supple without thyromegaly. Adenopathy: no enlarged lymph nodes appreciated in the anterior cervical, occipital, axillary, epitrochlear, inguinal, or popliteal regions Lungs: Decreased breath sounds bilaterally without wheezing, rhonchi or rales. No increased work of breathing. CV: Normal S1/S2, no murmurs. Capillary refill <2 seconds.  Abdomen: Nondistended, nontender. No guarding or rebound tenderness. Bowel sounds faint and present in all fields  Skin: Warm and dry, without lesions or rashes. She does have silvery psoriatic plaques over her bilateral elbows.  Extremities:  No clubbing, cyanosis or edema. Neuro:   Grossly intact. No focal deficits appreciated. Responsive to questions.  Diagnostic studies:  Spirometry: results normal (FEV1: 2.53/91%, FVC: 2.7/82%, FEV1/FVC: 92%).    Spirometry consistent with normal pattern. Albuterol/Atrovent nebulizer treatment given in clinic with no improvement.  Allergy Studies:   Indoor/Outdoor Percutaneous Adult Environmental Panel: positive to bahia grass, [place] grass,  johnson grass, [place] blue grass, meadow fescue grass, perennial rye grass, sweet vernal grass, timothy grass, short ragweed and English plantain. Otherwise negative with adequate controls.  Indoor/Outdoor Selected Intradermal Environmental Panel: positive to mold mix #1, mold mix #3, mold mix #4, dog and cockroach. Otherwise negative with adequate controls.      Salvatore Marvel, MD [facility]

## 2016-12-17 ENCOUNTER — Ambulatory Visit (INDEPENDENT_AMBULATORY_CARE_PROVIDER_SITE_OTHER): Payer: 59 | Admitting: Allergy & Immunology

## 2016-12-17 ENCOUNTER — Ambulatory Visit: Payer: 59 | Admitting: Allergy & Immunology

## 2016-12-17 ENCOUNTER — Encounter: Payer: Self-pay | Admitting: Allergy & Immunology

## 2016-12-17 VITALS — BP 122/68 | HR 82 | Temp 98.4°F | Resp 18

## 2016-12-17 DIAGNOSIS — L253 Unspecified contact dermatitis due to other chemical products: Secondary | ICD-10-CM | POA: Diagnosis not present

## 2016-12-17 DIAGNOSIS — L409 Psoriasis, unspecified: Secondary | ICD-10-CM | POA: Diagnosis not present

## 2016-12-17 DIAGNOSIS — J4540 Moderate persistent asthma, uncomplicated: Secondary | ICD-10-CM | POA: Diagnosis not present

## 2016-12-17 DIAGNOSIS — J3089 Other allergic rhinitis: Secondary | ICD-10-CM | POA: Diagnosis not present

## 2016-12-17 MED ORDER — EPINEPHRINE 0.3 MG/0.3ML IJ SOAJ: 0.3000 mg | Freq: Once | INTRAMUSCULAR | 2 refills | Status: DC

## 2016-12-17 NOTE — Progress Notes (Addendum)
Emlyn returns to the office 12/19/16 and again on 12/21/16 for the 48hr and 96hr patch test interpretation, given suspected history of contact dermatitis.    Diagnostics:  TRUE TEST48 hour reading:  Positive to #1 nickel, #17 colophony and #20 PPD all with slight erythema with scaling.   TRUE TEST 96 hour reading:  Back is clear  Plan:  Allergic contact dermatitis  The patient has been provided detailed information regarding the substances she is sensitive to, as well as products containing the substances.  Meticulous avoidance of these substances is recommended. If avoidance is not possible, the use of barrier creams or lotions is recommended. If symptoms persist or progress despite meticulous avoidance of nickel, colophony, PPD, dermatology evaluation may be warranted.  Prudy Feeler, MD [facility]  Date of Service/Encounter:  12/17/16   Assessment:   Contact dermatitis due to chemicals  Chronic nonseasonal allergic rhinitis due to pollen  Moderate persistent asthma, uncomplicated  Psoriasis   Psoriasis - starting Humira today  Moderate persistent asthma  Allergic rhinitis    Plan/Recommendations:   1. Contact dermatitis due to chemicals - Patches were placed on your back today. - Do not take a shower until they are removed on Wednesday.  - Come back on Wednesday and Friday for patch readings.   2. Psoriasis - It is Ok to start Humira with the allergy shots starting as well.  3. Moderate persistent asthma - No changes to the current regimen. - Dr. Creig Hines follows the patient and manages her medications  4. Allergic rhinitis - Allergy shots ordered and patient to start next week. - EpiPen sent in.   5. Return in about 2 days (around 12/19/2016).    Subjective:   TAYLON MUSTAPHA is a 45 y.o. female presenting today for follow up of  Chief Complaint  Patient presents with  . Allergy  Testing    patch test  .  Burnard Bunting has a history of the following: Patient Active Problem List   Diagnosis Date Noted  . Psoriasis 12/17/2016  . Moderate persistent asthma, uncomplicated [id number]  . Chronic nonseasonal allergic rhinitis due to pollen 12/17/2016  . Contact dermatitis due to chemicals 12/17/2016  . Thyroid nodule 10/31/2016  . Asthma 09/18/2016  . Allergic rhinitis 09/18/2016  . Chronic back pain 07/30/2016  . Migraines 07/30/2016    History obtained from: chart review and patient.  Burnard Bunting was referred by Leonie Douglas, PA-C.     Conchetta is a 45 y.o. female presenting for a follow up visit. I last saw Sayumi for her first visit last week. At that time, we did allergy testing that was positive to grasses, weeds, ragweed, molds, dog, and cockroach. We started fluticasone-azelastine nasal spray 1-2 times daily and we ordered immunotherapy. She has a history of persistent asthma and is followed by Dr. Ashok Cordia. She has a history of intermittent pruritic rashes with blisters (distinct from hives) that occur from unknown triggers. She does know that nickel results in a rash. She has has a couple of Retail banker services and has reacted in the past to various cleaning supplies. The rashes are very pruritic and irritating, leading sometimes to the formation of pustular lesions. She uses gloves now which seem to control the symptoms for the most part.  She is getting Humira for her psoriasis tomorrow and wants to make sure that she can still receive allergy  shots. Otherwise, there have been no changes to her past medical history, surgical history, family history, or social history.    Review of Systems: a 14-point review of systems is pertinent for what is mentioned in HPI.  Otherwise, all other systems were negative. Constitutional: negative other than that listed in the HPI Eyes: negative other than that listed in the HPI Ears, nose, mouth, throat, and face:  negative other than that listed in the HPI Respiratory: negative other than that listed in the HPI Cardiovascular: negative other than that listed in the HPI Gastrointestinal: negative other than that listed in the HPI Genitourinary: negative other than that listed in the HPI Integument: negative other than that listed in the HPI Hematologic: negative other than that listed in the HPI Musculoskeletal: negative other than that listed in the HPI Neurological: negative other than that listed in the HPI Allergy/Immunologic: negative other than that listed in the HPI    Objective:   Blood pressure 122/68, pulse 82, temperature 98.4 F (36.9 C), temperature source Oral, resp. rate 18, SpO2 98 %. There is no height or weight on file to calculate BMI.   Physical Exam:  General: Alert, interactive, in no acute distress. Pleasant female. Cooperative with the exam. Eyes: No conjunctival injection present on the right, No conjunctival injection present on the left, PERRL bilaterally, No discharge on the right, No discharge on the left and allergic shiners present bilaterally Ears: Right TM pearly gray with normal light reflex, Left TM pearly gray with normal light reflex, Right TM intact without perforation and Left TM intact without perforation.  Nose/Throat: External nose within normal limits and septum midline, turbinates edematous without discharge, post-pharynx markedly erythematous without cobblestoning in the posterior oropharynx. Tonsils 2+ without exudates Neck: Supple without thyromegaly. Lungs: Clear to auscultation without wheezing, rhonchi or rales. No increased work of breathing. CV: Normal S1/S2, no murmurs. Capillary refill <2 seconds.  Abdomen: Nondistended, nontender. No guarding or rebound tenderness. Bowel sounds present in all fields and hypoactive  Skin: Warm and dry, without lesions or rashes. No dermatitis appreciated today. Neuro:   Grossly intact. No focal deficits  appreciated. Responsive to questions.   Diagnostic studies: patch tests placed today    Salvatore Marvel, MD [facility]

## 2016-12-17 NOTE — Patient Instructions (Addendum)
1. Contact dermatitis due to chemicals - Patches were placed on your back today. - Do not take a shower until they are removed on Wednesday.  - Come back on Wednesday and Friday for patch readings.   2. Return in about 2 days (around 12/19/2016).  Please inform us of any Emergency Department visits, hospitalizations, or changes in symptoms. Call us before going to the ED for breathing or allergy symptoms since we might be able to fit you in for a sick visit. Feel free to contact us anytime with any questions, problems, or concerns.  It was a pleasure to see you again today! Have a wonderful holiday season!   Websites that have reliable patient information: 1. American Academy of Asthma, Allergy, and Immunology: www.aaaai.org 2. Food Allergy Research and Education (FARE): foodallergy.org 3. Mothers of Asthmatics: http://www.asthmacommunitynetwork.org 4. American College of Allergy, Asthma, and Immunology: www.acaai.org

## 2016-12-19 ENCOUNTER — Encounter: Payer: 59 | Admitting: Allergy

## 2016-12-21 ENCOUNTER — Encounter: Payer: 59 | Admitting: Allergy

## 2017-02-20 ENCOUNTER — Ambulatory Visit: Payer: 59 | Admitting: Pulmonary Disease

## 2017-03-05 ENCOUNTER — Ambulatory Visit: Payer: 59 | Admitting: Pulmonary Disease

## 2017-03-14 ENCOUNTER — Ambulatory Visit (INDEPENDENT_AMBULATORY_CARE_PROVIDER_SITE_OTHER): Payer: 59 | Admitting: Pulmonary Disease

## 2017-03-14 ENCOUNTER — Encounter: Payer: Self-pay | Admitting: Pulmonary Disease

## 2017-03-14 VITALS — BP 140/100 | HR 90 | Ht 65.75 in | Wt 165.0 lb

## 2017-03-14 DIAGNOSIS — J302 Other seasonal allergic rhinitis: Secondary | ICD-10-CM | POA: Diagnosis not present

## 2017-03-14 DIAGNOSIS — J4550 Severe persistent asthma, uncomplicated: Secondary | ICD-10-CM | POA: Diagnosis not present

## 2017-03-14 NOTE — Patient Instructions (Signed)
   Continue with your current regimen.  Restart your Symbicort at the end of the month.  Call me if you have any new breathing problems or questions before your next appointment.

## 2017-03-14 NOTE — Progress Notes (Signed)
Subjective:    Patient ID: Christine Miranda, female    DOB: 1971/08/05, 46 y.o.   MRN: [id number]  C.C.:  Follow-up Severe, Persistent Asthma, & Chronic Seasonal Allergic Rhinitis.   HPI Severe, Persistent Asthma:  Patient stopped her Symbicort since last appointment. She reports she had an asthma flare with her allergy testing, specifically the grasses. She reports she has had rare, infrequent coughing. She denies any constant wheezing. No recent nocturnal awakenings with coughing.   Chronic Seasonal Allergic Rhinitis:  The patient has seen her allergies and underwent testing. She is holding off on treatment until her deductible is met. She reports she triggered on grass, mold, dogs, and cockroaches. She reports that the grasses seemed to be the most severe. She reports increased sinus congestion, pressure, & drainage in the last few weeks. She is using her Flonase in place of Dymista twice daily, Singulair & Zyrtec.   Review of Systems Rare chest tightness. No chest pressure or pain. No fever or chills. No abdominal pain or nausea.   Allergies  Allergen Reactions  . Hydrocodone Hives  . Iodine Hives  . Prednisone     Rapid heartrate  . Tincture Of Benzoin [Benzoin]     Blister    Current Outpatient Prescriptions on File Prior to Visit  Medication Sig Dispense Refill  . Adalimumab (HUMIRA) 40 MG/0.8ML PSKT Inject 40 mLs into the skin every 14 (fourteen) days.    . Azelastine-Fluticasone 137-50 MCG/ACT SUSP Place 2 sprays into the nose 2 (two) times daily. 23 g 4  . cetirizine (ZYRTEC) 10 MG tablet Take 1 tablet (10 mg total) by mouth daily. 30 tablet 11  . levalbuterol (XOPENEX HFA) 45 MCG/ACT inhaler Inhale 1-2 puffs into the lungs every 4 (four) hours as needed for wheezing. 1 Inhaler 3  . levalbuterol (XOPENEX) 0.63 MG/3ML nebulizer solution Take 3 mLs (0.63 mg total) by nebulization once. 3 mL 12  . methocarbamol (ROBAXIN) 750 MG tablet Take 750 mg by mouth 4 (four) times daily as  needed (for muscle spasms).    . montelukast (SINGULAIR) 10 MG tablet TAKE 1 TABLET (10 MG TOTAL) BY MOUTH AT BEDTIME. 30 tablet 1  . Spacer/Aero-Holding Chambers (AEROCHAMBER Z-STAT PLUS CHAMBR) MISC Use as directed 1 each 0  . budesonide-formoterol (SYMBICORT) 160-4.5 MCG/ACT inhaler Inhale 2 puffs into the lungs 2 (two) times daily. (Patient not taking: Reported on 03/14/2017) 1 Inhaler 6   No current facility-administered medications on file prior to visit.     Past Medical History:  Diagnosis Date  . Allergic rhinitis   . Asthma   . Eczema   . Lower half migraine   . Pelvic fracture [facility])     Past Surgical History:  Procedure Laterality Date  . ABDOMINAL HYSTERECTOMY    . arm surgery    . CHOLECYSTECTOMY    . ELBOW SURGERY    . HAND SURGERY    . KNEE SURGERY     x 2  . OVARY SURGERY    . PELVIC SYMPHYSIS FUSION    . SPINAL CORD STIMULATOR IMPLANT      Family History  Problem Relation Age of Onset  . Hyperlipidemia Mother   . Hypertension Mother   . Rheumatologic disease Mother   . Hyperlipidemia Father   . Deep vein thrombosis Father     Provoked  . Pulmonary embolism Father     Provoked  . Asthma Son   . Eczema Son   . Arthritis Maternal Aunt   .  Asthma Maternal Grandmother   . Diabetes Maternal Grandmother   . Allergic rhinitis Neg Hx   . Angioedema Neg Hx   . Immunodeficiency Neg Hx   . Urticaria Neg Hx     Social History   Social History  . Marital status: Married    Spouse name: N/A  . Number of children: N/A  . Years of education: N/A   Social History Main Topics  . Smoking status: Former Smoker    Packs/day: 0.50    Years: 5.00    Quit date: 12/31/1988  . Smokeless tobacco: Never Used     Comment: Stepfather for 5 years   . Alcohol use 0.6 - 1.2 oz/week    1 - 2 Glasses of wine per week  . Drug use: No  . Sexual activity: Not Asked   Other Topics Concern  . None   Social History Narrative   Originally from [place], [place]. Has  lived in [place], [place], [place], [place]. Moved to [place] in 1996. She has 2 [employer]. She does have some exposure to cleaning chemical fumes. Has a cat. No bird, mold, or hot tub exposure. Enjoys shopping.       Objective:   Physical Exam BP (!) 140/100 (BP Location: Left Arm, Patient Position: Sitting, Cuff Size: Normal)   Pulse 90   Ht 5' 5.75" (1.67 m)   Wt 165 lb (74.8 kg)   SpO2 98%   BMI 26.83 kg/m  Gen.: No distress. Awake. Alert. Integument: Warm and dry. Without rash or bruising on exposed skin. Pulmonary: Good aeration bilaterally. Clear with auscultation. Speaking in complete sentences. Abdomen: Soft. Nondistended. Normal bowel sounds. Cardiovascular: Regular rate. No edema. No JVD. HEENT: No scleral icterus. Minimal nasal turbinate swelling. No oral ulcers.  PFT 11/14/16:FVC 3.04 L (79%) FEV1 2.66 L (86%) FEV1/FVC 0.87 FEF 25-75 3.60 L (119%) negative bronchodilator response TLC 4.69 L (87%) RV 90% ERV 40% DLCO uncorrected 91%  IMAGING CT CHEST W/O 10/17/16 (previously reviewed by me): 1 cm hypodense right thyroid nodule. No pathologic mediastinal adenopathy. No pericardial effusion. No pleural effusion or thickening. No parenchymal nodule or opacity.  CXR PA/LAT 07/30/16 (previously reviewed by me): No focal opacity, mass, or nodule appreciated. No pleural effusion appreciated. Heart normal in size & mediastinum normal in contour.  LABS 09/18/16 CBC: 8.4/15.0/43.1/307 Eosinophils: 0.1 IgE: 7 RAST Panel:  [place] grass 0.15 / [place] Grass 0.22 / Timothy Grass 1.03  07/30/16 CBC: 9.2/15.0/41.3/291 D dimer: 0.25    Assessment & Plan:  46 y.o. with severe, persistent asthma and chronic seasonal allergic rhinitis. overall patient's allergic rhinitis seems to be very well controlled. She has minimal to no symptoms from her asthma but is not currently in her peak months of exposure which could precipitate exacerbations. I did instruct the patient to resume her  inhaled corticosteroid and long-acting bronchodilator therapy soon as the pollen season may be somewhat earlier this year. I instructed the patient contact my office if she had any new breathing problems or questions before her next appointment.   1. Severe, persistent asthma:  Patient to resume using her Symbicort at the end of the month. 2. Chronic seasonal allergic rhinitis: Patient continuing on her current regimen of Singulair, Flonase, and Zyrtec. Patient to undergo desensitization therapy once she can afford it.  3. Health maintenance: Status post influenza vaccine November 2017. 4. Follow-up: Return to clinic in 3 months or sooner if needed.  Christine Miranda, M.D. [facility] Pulmonary & Critical Care Pager:  [phone] After 3pm or if no response, call [phone] 4:14 PM 03/14/17

## 2017-06-19 ENCOUNTER — Ambulatory Visit: Payer: 59 | Admitting: Pulmonary Disease

## 2017-07-15 DIAGNOSIS — L7211 Pilar cyst: Secondary | ICD-10-CM | POA: Diagnosis not present

## 2017-07-15 DIAGNOSIS — L400 Psoriasis vulgaris: Secondary | ICD-10-CM | POA: Diagnosis not present

## 2017-07-16 DIAGNOSIS — G894 Chronic pain syndrome: Secondary | ICD-10-CM | POA: Diagnosis not present

## 2017-07-16 DIAGNOSIS — G90523 Complex regional pain syndrome I of lower limb, bilateral: Secondary | ICD-10-CM | POA: Diagnosis not present

## 2017-08-16 ENCOUNTER — Ambulatory Visit (INDEPENDENT_AMBULATORY_CARE_PROVIDER_SITE_OTHER): Payer: 59 | Admitting: Endocrinology

## 2017-08-16 ENCOUNTER — Encounter: Payer: Self-pay | Admitting: Endocrinology

## 2017-08-16 DIAGNOSIS — E042 Nontoxic multinodular goiter: Secondary | ICD-10-CM | POA: Diagnosis not present

## 2017-08-16 LAB — TSH: TSH: 2.37 u[IU]/mL (ref 0.35–4.50)

## 2017-08-16 NOTE — Progress Notes (Signed)
Subjective:    Patient ID: Christine Miranda, female    DOB: May 02, 1971, 46 y.o.   MRN: [id number]  HPI Pt returns for f/u of multinodular goiter (she had postpartum thyroiditis after pregnancies in 1991 and 1993; she says she had episodes of both hypo- and hyperthyroidism, then, but TFT have been normal off rx since the late-1990's; she was incidentally noted to have a nodule at the thyroid on a CT in Aug of 2017; [place] in 2017 showed 1.3 cm right upper pole and 0.8 cm left mid lobe nodules; bx of the right nodule was beth cat 2).  She notices slight swelling at the anterior neck, but no assoc pain.   Past Medical History:  Diagnosis Date  . Allergic rhinitis   . Asthma   . Eczema   . Lower half migraine   . Pelvic fracture [facility])     Past Surgical History:  Procedure Laterality Date  . ABDOMINAL HYSTERECTOMY    . arm surgery    . CHOLECYSTECTOMY    . ELBOW SURGERY    . HAND SURGERY    . KNEE SURGERY     x 2  . OVARY SURGERY    . PELVIC SYMPHYSIS FUSION    . SPINAL CORD STIMULATOR IMPLANT      Social History   Social History  . Marital status: Married    Spouse name: N/A  . Number of children: N/A  . Years of education: N/A   Occupational History  . Not on file.   Social History Main Topics  . Smoking status: Former Smoker    Packs/day: 0.50    Years: 5.00    Quit date: 12/31/1988  . Smokeless tobacco: Never Used     Comment: Stepfather for 5 years   . Alcohol use 0.6 - 1.2 oz/week    1 - 2 Glasses of wine per week  . Drug use: No  . Sexual activity: Not on file   Other Topics Concern  . Not on file   Social History Narrative   Originally from [place], [place]. Has lived in [place], [place], [place], [place]. Moved to [place] in 1996. She has 2 [employer]. She does have some exposure to cleaning chemical fumes. Has a cat. No bird, mold, or hot tub exposure. Enjoys shopping.     Current Outpatient Prescriptions on File Prior to Visit  Medication Sig Dispense  Refill  . Adalimumab (HUMIRA) 40 MG/0.8ML PSKT Inject 40 mLs into the skin every 14 (fourteen) days.    . Azelastine-Fluticasone 137-50 MCG/ACT SUSP Place 2 sprays into the nose 2 (two) times daily. 23 g 4  . budesonide-formoterol (SYMBICORT) 160-4.5 MCG/ACT inhaler Inhale 2 puffs into the lungs 2 (two) times daily. 1 Inhaler 6  . cetirizine (ZYRTEC) 10 MG tablet Take 1 tablet (10 mg total) by mouth daily. 30 tablet 11  . EPINEPHrine (EPIPEN 2-PAK) 0.3 mg/0.3 mL IJ SOAJ injection Inject into the muscle once.    . levalbuterol (XOPENEX HFA) 45 MCG/ACT inhaler Inhale 1-2 puffs into the lungs every 4 (four) hours as needed for wheezing. 1 Inhaler 3  . levalbuterol (XOPENEX) 0.63 MG/3ML nebulizer solution Take 3 mLs (0.63 mg total) by nebulization once. 3 mL 12  . methocarbamol (ROBAXIN) 750 MG tablet Take 750 mg by mouth 4 (four) times daily as needed (for muscle spasms).    . montelukast (SINGULAIR) 10 MG tablet TAKE 1 TABLET (10 MG TOTAL) BY MOUTH AT BEDTIME. 30 tablet 1  .  Spacer/Aero-Holding Chambers (AEROCHAMBER Z-STAT PLUS CHAMBR) MISC Use as directed 1 each 0   No current facility-administered medications on file prior to visit.     Allergies  Allergen Reactions  . Hydrocodone Hives  . Iodine Hives  . Prednisone     Rapid heartrate  . Tincture Of Benzoin [Benzoin]     Blister    Family History  Problem Relation Age of Onset  . Hyperlipidemia Mother   . Hypertension Mother   . Rheumatologic disease Mother   . Hyperlipidemia Father   . Deep vein thrombosis Father        Provoked  . Pulmonary embolism Father        Provoked  . Asthma Son   . Eczema Son   . Arthritis Maternal Aunt   . Asthma Maternal Grandmother   . Diabetes Maternal Grandmother   . Allergic rhinitis Neg Hx   . Angioedema Neg Hx   . Immunodeficiency Neg Hx   . Urticaria Neg Hx     BP 130/88 (BP Location: Left Arm, Patient Position: Sitting)   Pulse 81   Wt 163 lb (73.9 kg)   SpO2 98%   BMI 26.51  kg/m   Review of Systems She has thinning hair.     Objective:   Physical Exam VITAL SIGNS:  See vs page.   GENERAL: no distress.  NECK: There is a possible palpable left thyroid nodule.   Lab Results  Component Value Date   TSH 2.37 08/16/2017      Assessment & Plan:  Multinodular goiter.  Euthyroid.  Due for recheck.  Patient Instructions  Let's recheck the thyroid ultrasound.  you will receive a phone call, about a day and time for an appointment.   blood tests are requested for you today.  We'll let you know about the results. You are at risk for an over or underactive thyroid in the future, so you should have your blood tests checked at least each year.   If these test results are unchanged, Please return in 1-2 years.

## 2017-08-16 NOTE — Patient Instructions (Addendum)
Let's recheck the thyroid ultrasound.  you will receive a phone call, about a day and time for an appointment.   blood tests are requested for you today.  We'll let you know about the results. You are at risk for an over or underactive thyroid in the future, so you should have your blood tests checked at least each year.   If these test results are unchanged, Please return in 1-2 years.

## 2017-08-26 ENCOUNTER — Ambulatory Visit (INDEPENDENT_AMBULATORY_CARE_PROVIDER_SITE_OTHER): Payer: 59 | Admitting: Pulmonary Disease

## 2017-08-26 ENCOUNTER — Encounter: Payer: Self-pay | Admitting: Pulmonary Disease

## 2017-08-26 ENCOUNTER — Ambulatory Visit
Admission: RE | Admit: 2017-08-26 | Discharge: 2017-08-26 | Disposition: A | Discharge status: Home / Self Care | Payer: 59 | Source: Ambulatory Visit | Attending: Endocrinology | Admitting: Endocrinology

## 2017-08-26 VITALS — BP 126/80 | HR 81 | Ht 66.0 in | Wt 165.2 lb

## 2017-08-26 DIAGNOSIS — R059 Cough, unspecified: Secondary | ICD-10-CM

## 2017-08-26 DIAGNOSIS — R05 Cough: Secondary | ICD-10-CM | POA: Diagnosis not present

## 2017-08-26 DIAGNOSIS — J302 Other seasonal allergic rhinitis: Secondary | ICD-10-CM | POA: Diagnosis not present

## 2017-08-26 DIAGNOSIS — E042 Nontoxic multinodular goiter: Secondary | ICD-10-CM

## 2017-08-26 DIAGNOSIS — J4550 Severe persistent asthma, uncomplicated: Secondary | ICD-10-CM | POA: Diagnosis not present

## 2017-08-26 MED ORDER — LEVALBUTEROL TARTRATE 45 MCG/ACT IN AERO: 1.0000 | INHALATION_SPRAY | RESPIRATORY_TRACT | 5 refills | Status: DC | PRN

## 2017-08-26 MED ORDER — CETIRIZINE HCL 10 MG PO TABS: 10.0000 mg | ORAL_TABLET | Freq: Every day | ORAL | 5 refills | Status: DC

## 2017-08-26 MED ORDER — BUDESONIDE-FORMOTEROL FUMARATE 160-4.5 MCG/ACT IN AERO: 2.0000 | INHALATION_SPRAY | Freq: Two times a day (BID) | RESPIRATORY_TRACT | 6 refills | Status: DC

## 2017-08-26 MED ORDER — MONTELUKAST SODIUM 10 MG PO TABS: 10.0000 mg | ORAL_TABLET | Freq: Every day | ORAL | 5 refills | Status: DC

## 2017-08-26 MED ORDER — AZELASTINE-FLUTICASONE 137-50 MCG/ACT NA SUSP: 2.0000 | Freq: Every day | NASAL | 0 refills | Status: DC

## 2017-08-26 NOTE — Patient Instructions (Addendum)
   Continue using your medications as prescribed.   Use the Dymista sample in place of your Flonase while it lasts. Use this by doing 1 spray in each nostril twice daily.  Call or e-mail me if you have any new breathing problems or questions before your next appointment.  We will see you back in 6 months or sooner if needed.

## 2017-08-26 NOTE — Progress Notes (Signed)
Subjective:    Patient ID: Christine Miranda, female    DOB: 1971-03-31, 46 y.o.   MRN: [id number]  C.C.:  Follow-up Severe, Persistent Asthma, & Chronic Seasonal Allergic Rhinitis.   HPI Severe, persistent asthma: At last appointment patient plan to resume her Symbicort. She is adherent to the Symbicort. She reports minimal wheezing. Rare cough. She is using her rescue inhaler sporadically. No flares since last exacerbation. No nocturnal awakenings.   Chronic seasonal allergic rhinitis: Previously underwent testing but unable to afford desensitization due to high deductible. Regimen of Flonase, Singulair, and Zyrtec continued at last appointment. She still has sinus congestion & drainage.   Review of Systems She denies any chest tightness or pressure. No fever or chills. No abdominal pain or nausea.   Allergies  Allergen Reactions  . Hydrocodone Hives  . Iodine Hives  . Prednisone     Rapid heartrate  . Tincture Of Benzoin [Benzoin]     Blister    Current Outpatient Prescriptions on File Prior to Visit  Medication Sig Dispense Refill  . Adalimumab (HUMIRA) 40 MG/0.8ML PSKT Inject 40 mLs into the skin every 14 (fourteen) days.    . Azelastine-Fluticasone 137-50 MCG/ACT SUSP Place 2 sprays into the nose 2 (two) times daily. 23 g 4  . budesonide-formoterol (SYMBICORT) 160-4.5 MCG/ACT inhaler Inhale 2 puffs into the lungs 2 (two) times daily. 1 Inhaler 6  . cetirizine (ZYRTEC) 10 MG tablet Take 1 tablet (10 mg total) by mouth daily. 30 tablet 11  . EPINEPHrine (EPIPEN 2-PAK) 0.3 mg/0.3 mL IJ SOAJ injection Inject into the muscle once.    . levalbuterol (XOPENEX HFA) 45 MCG/ACT inhaler Inhale 1-2 puffs into the lungs every 4 (four) hours as needed for wheezing. 1 Inhaler 3  . levalbuterol (XOPENEX) 0.63 MG/3ML nebulizer solution Take 3 mLs (0.63 mg total) by nebulization once. 3 mL 12  . methocarbamol (ROBAXIN) 750 MG tablet Take 750 mg by mouth 4 (four) times daily as needed (for muscle  spasms).    . montelukast (SINGULAIR) 10 MG tablet TAKE 1 TABLET (10 MG TOTAL) BY MOUTH AT BEDTIME. 30 tablet 1  . Spacer/Aero-Holding Chambers (AEROCHAMBER Z-STAT PLUS CHAMBR) MISC Use as directed 1 each 0   No current facility-administered medications on file prior to visit.     Past Medical History:  Diagnosis Date  . Allergic rhinitis   . Asthma   . Eczema   . Lower half migraine   . Pelvic fracture [facility])     Past Surgical History:  Procedure Laterality Date  . ABDOMINAL HYSTERECTOMY    . arm surgery    . CHOLECYSTECTOMY    . ELBOW SURGERY    . HAND SURGERY    . KNEE SURGERY     x 2  . OVARY SURGERY    . PELVIC SYMPHYSIS FUSION    . SPINAL CORD STIMULATOR IMPLANT      Family History  Problem Relation Age of Onset  . Hyperlipidemia Mother   . Hypertension Mother   . Rheumatologic disease Mother   . Hyperlipidemia Father   . Deep vein thrombosis Father        Provoked  . Pulmonary embolism Father        Provoked  . Asthma Son   . Eczema Son   . Arthritis Maternal Aunt   . Asthma Maternal Grandmother   . Diabetes Maternal Grandmother   . Allergic rhinitis Neg Hx   . Angioedema Neg Hx   . Immunodeficiency  Neg Hx   . Urticaria Neg Hx     Social History   Social History  . Marital status: Married    Spouse name: N/A  . Number of children: N/A  . Years of education: N/A   Social History Main Topics  . Smoking status: Former Smoker    Packs/day: 0.50    Years: 5.00    Quit date: 12/31/1988  . Smokeless tobacco: Never Used     Comment: Stepfather for 5 years   . Alcohol use 0.6 - 1.2 oz/week    1 - 2 Glasses of wine per week  . Drug use: No  . Sexual activity: Not Asked   Other Topics Concern  . None   Social History Narrative   Originally from [place], [place]. Has lived in [place], [place], [place], [place]. Moved to [place] in 1996. She has 2 [employer]. She does have some exposure to cleaning chemical fumes. Has a cat. No bird, mold, or hot  tub exposure. Enjoys shopping.       Objective:   Physical Exam BP 126/80 (BP Location: Left Arm, Cuff Size: Normal)   Pulse 81   Ht 5\' 6"  (1.676 m)   Wt 165 lb 3.2 oz (74.9 kg)   SpO2 96%   BMI 26.66 kg/m   General:  Awake. Caucasian female. No distress. Integument:  Warm & dry. No rash on exposed skin. No bruising on exposed skin. Extremities:  No cyanosis or clubbing.  HEENT:  Moist mucus membranes. Mild to moderate bilateral nasal turbinate swelling. No oral ulcers Cardiovascular:  Regular rate. No edema. Regular rhythm.  Pulmonary:  Clear bilaterally to auscultation with good aeration. Normal work of breathing on room air. Abdomen: Soft. Normal bowel sounds. Nondistended. Musculoskeletal:  Normal bulk and tone. No joint deformity or effusion appreciated.  PFT 11/14/16: FVC 3.04 L (79%) FEV1 2.66 L (86%) FEV1/FVC 0.87 FEF 25-75 3.60 L (119%) negative bronchodilator response TLC 4.69 L (87%) RV 90% ERV 40% DLCO uncorrected 91%  IMAGING CT CHEST W/O 10/17/16 (previously reviewed by me): 1 cm hypodense right thyroid nodule. No pathologic mediastinal adenopathy. No pericardial effusion. No pleural effusion or thickening. No parenchymal nodule or opacity.  CXR PA/LAT 07/30/16 (previously reviewed by me): No focal opacity, mass, or nodule appreciated. No pleural effusion appreciated. Heart normal in size & mediastinum normal in contour.  LABS 09/18/16 CBC: 8.4/15.0/43.1/307 Eosinophils: 0.1 IgE: 7 RAST Panel:  [place] grass 0.15 / [place] Grass 0.22 / Timothy Grass 1.03  07/30/16 CBC: 9.2/15.0/41.3/291 D dimer: 0.25    Assessment & Plan:  46 y.o. female with severe, persistent asthma and chronic seasonal allergic rhinitis. Patient is having intermittent cough which is likely due to a combination of not only her asthma but also her chronic seasonal allergic rhinitis. I do feel that given her previous response to skin allergy testing she would benefit from desensitization  therapy. In the meantime we will continue the patient on her current regimen for treatment of both her asthma and her allergic rhinitis. I instructed the patient contact me if she had any new breathing problems or questions before her next appointment.  1. Severe, persistent asthma: Continuing patient on Symbicort. Patient to hopefully undergo desensitization therapy soon. Continuing Xopenex inhaler as needed. 2. Chronic seasonal allergic rhinitis:continuing patient on Singulair and Zyrtec. Given sample of Dymista to use in place of Flonase for now.  3. Health maintenance: Patient to receive influenza vaccine within the next month.  4. Follow-up: Return to clinic  in  6 months or sooner if needed.   Sonia Baller Ashok Cordia, M.D. [facility] Pulmonary & Critical Care Pager:  [phone] After 3pm or if no response, call [phone] 10:45 AM 08/26/17

## 2017-08-26 NOTE — Addendum Note (Signed)
Addended by: Len Blalock on: 08/26/2017 11:02 AM   Modules accepted: Orders

## 2017-09-07 ENCOUNTER — Other Ambulatory Visit: Payer: Self-pay | Admitting: Physician Assistant

## 2017-09-07 DIAGNOSIS — J309 Allergic rhinitis, unspecified: Secondary | ICD-10-CM

## 2017-12-27 DIAGNOSIS — Z79891 Long term (current) use of opiate analgesic: Secondary | ICD-10-CM | POA: Diagnosis not present

## 2017-12-27 DIAGNOSIS — L409 Psoriasis, unspecified: Secondary | ICD-10-CM | POA: Diagnosis not present

## 2018-01-15 DIAGNOSIS — L400 Psoriasis vulgaris: Secondary | ICD-10-CM | POA: Diagnosis not present

## 2018-02-18 ENCOUNTER — Other Ambulatory Visit: Payer: Self-pay | Admitting: Allergy & Immunology

## 2018-02-24 ENCOUNTER — Ambulatory Visit: Payer: 59 | Admitting: Pulmonary Disease

## 2018-03-11 ENCOUNTER — Ambulatory Visit: Payer: 59 | Admitting: Pulmonary Disease

## 2018-04-08 ENCOUNTER — Ambulatory Visit: Payer: 59 | Admitting: Pulmonary Disease

## 2018-04-14 ENCOUNTER — Ambulatory Visit (INDEPENDENT_AMBULATORY_CARE_PROVIDER_SITE_OTHER): Payer: 59 | Admitting: Pulmonary Disease

## 2018-04-14 ENCOUNTER — Encounter: Payer: Self-pay | Admitting: Pulmonary Disease

## 2018-04-14 VITALS — BP 130/90 | HR 114 | Ht 66.0 in | Wt 165.0 lb

## 2018-04-14 DIAGNOSIS — R05 Cough: Secondary | ICD-10-CM | POA: Diagnosis not present

## 2018-04-14 DIAGNOSIS — R059 Cough, unspecified: Secondary | ICD-10-CM

## 2018-04-14 DIAGNOSIS — J4520 Mild intermittent asthma, uncomplicated: Secondary | ICD-10-CM

## 2018-04-14 LAB — NITRIC OXIDE: NITRIC OXIDE: 11

## 2018-04-14 MED ORDER — BENZONATATE 200 MG PO CAPS: 200.0000 mg | ORAL_CAPSULE | Freq: Three times a day (TID) | ORAL | 2 refills | Status: DC | PRN

## 2018-04-14 NOTE — Patient Instructions (Addendum)
Sorry that your cough is worse today. Continue your inhalers, Zyrtec, singular, nasal sprays as prescribed Use saline nasal sprays. We will try Tessalon 200 mg 3 times a day as needed for cough Follow-up in 3 months.

## 2018-04-14 NOTE — Progress Notes (Signed)
Christine Miranda    [id number]    11/22/1971  Primary Care Physician:Wiseman, Reather Laurence, PA-C  Referring Physician: Leonie Douglas, PA-C [address] [place], [place] [address]  Chief complaint:   Follow-up for severe persistent asthma, allergic rhinitis  HPI: 47 year old with history of psoriasis on Humira therapy, severe asthma, allergic rhinitis.  Previously followed by Dr. Ashok Cordia She is maintained on Symbicort, Xopenex nebs.  For the past 2 weeks she reports worsening symptoms related to change in season She is using her rescue medication up to 6 times a day.  Denies any nighttime awakening, sputum production, fevers, chills  She has history of chronic seasonal rhinitis.  Previously underwent testing but unable to afford desensitization due to high deductible.  She is on a regimen of Astelin, Flonase, Zyrtec, Singulair  Outpatient Encounter Medications as of 04/14/2018  Medication Sig  . Adalimumab (HUMIRA) 40 MG/0.8ML PSKT Inject 40 mLs into the skin every 14 (fourteen) days.  . Azelastine-Fluticasone (DYMISTA) 137-50 MCG/ACT SUSP Place 2 puffs into the nose daily.  . Azelastine-Fluticasone 137-50 MCG/ACT SUSP Place 2 sprays into the nose 2 (two) times daily.  . budesonide-formoterol (SYMBICORT) 160-4.5 MCG/ACT inhaler Inhale 2 puffs into the lungs 2 (two) times daily.  . cetirizine (ZYRTEC) 10 MG tablet Take 1 tablet (10 mg total) by mouth daily.  Marland Kitchen EPINEPHrine (EPIPEN 2-PAK) 0.3 mg/0.3 mL IJ SOAJ injection Inject into the muscle once.  . levalbuterol (XOPENEX HFA) 45 MCG/ACT inhaler Inhale 1-2 puffs into the lungs every 4 (four) hours as needed for wheezing.  . levalbuterol (XOPENEX) 0.63 MG/3ML nebulizer solution Take 3 mLs (0.63 mg total) by nebulization once.  . methocarbamol (ROBAXIN) 750 MG tablet Take 750 mg by mouth 4 (four) times daily as needed (for muscle spasms).  . montelukast (SINGULAIR) 10 MG tablet Take 1 tablet (10 mg total) by mouth at bedtime.  Marland Kitchen  Spacer/Aero-Holding Chambers (AEROCHAMBER Z-STAT PLUS CHAMBR) MISC Use as directed   No facility-administered encounter medications on file as of 04/14/2018.     Allergies as of 04/14/2018 - Review Complete 04/14/2018  Allergen Reaction Noted  . Hydrocodone Hives 07/14/2013  . Iodine Hives 07/14/2013  . Prednisone  07/14/2013  . Tincture of benzoin [benzoin]  07/30/2016    Past Medical History:  Diagnosis Date  . Allergic rhinitis   . Asthma   . Eczema   . Lower half migraine   . Pelvic fracture [facility])     Past Surgical History:  Procedure Laterality Date  . ABDOMINAL HYSTERECTOMY    . arm surgery    . CHOLECYSTECTOMY    . ELBOW SURGERY    . HAND SURGERY    . KNEE SURGERY     x 2  . OVARY SURGERY    . PELVIC SYMPHYSIS FUSION    . SPINAL CORD STIMULATOR IMPLANT      Family History  Problem Relation Age of Onset  . Hyperlipidemia Mother   . Hypertension Mother   . Rheumatologic disease Mother   . Hyperlipidemia Father   . Deep vein thrombosis Father        Provoked  . Pulmonary embolism Father        Provoked  . Asthma Son   . Eczema Son   . Arthritis Maternal Aunt   . Asthma Maternal Grandmother   . Diabetes Maternal Grandmother   . Allergic rhinitis Neg Hx   . Angioedema Neg Hx   . Immunodeficiency Neg Hx   .  Urticaria Neg Hx     Social History   Socioeconomic History  . Marital status: Married    Spouse name: Not on file  . Number of children: Not on file  . Years of education: Not on file  . Highest education level: Not on file  Occupational History  . Not on file  Social Needs  . Financial resource strain: Not on file  . Food insecurity:    Worry: Not on file    Inability: Not on file  . Transportation needs:    Medical: Not on file    Non-medical: Not on file  Tobacco Use  . Smoking status: Former Smoker    Packs/day: 0.50    Years: 5.00    Pack years: 2.50    Last attempt to quit: 12/31/1988    Years since quitting: 29.3  . Smokeless  tobacco: Never Used  . Tobacco comment: Stepfather for 5 years   Substance and Sexual Activity  . Alcohol use: Yes    Alcohol/week: 0.6 - 1.2 oz    Types: 1 - 2 Glasses of wine per week  . Drug use: No  . Sexual activity: Not on file  Lifestyle  . Physical activity:    Days per week: Not on file    Minutes per session: Not on file  . Stress: Not on file  Relationships  . Social connections:    Talks on phone: Not on file    Gets together: Not on file    Attends religious service: Not on file    Active member of club or organization: Not on file    Attends meetings of clubs or organizations: Not on file    Relationship status: Not on file  . Intimate partner violence:    Fear of current or ex partner: Not on file    Emotionally abused: Not on file    Physically abused: Not on file    Forced sexual activity: Not on file  Other Topics Concern  . Not on file  Social History Narrative   Originally from [place], [place]. Has lived in [place], [place], [place], [place]. Moved to  in 1996. She has 2 [employer]. She does have some exposure to cleaning chemical fumes. Has a cat. No bird, mold, or hot tub exposure. Enjoys shopping.     Review of systems: Review of Systems  Constitutional: Negative for fever and chills.  HENT: Negative.   Eyes: Negative for blurred vision.  Respiratory: as per HPI  Cardiovascular: Negative for chest pain and palpitations.  Gastrointestinal: Negative for vomiting, diarrhea, blood per rectum. Genitourinary: Negative for dysuria, urgency, frequency and hematuria.  Musculoskeletal: Negative for myalgias, back pain and joint pain.  Skin: Negative for itching and rash.  Neurological: Negative for dizziness, tremors, focal weakness, seizures and loss of consciousness.  Endo/Heme/Allergies: Negative for environmental allergies.  Psychiatric/Behavioral: Negative for depression, suicidal ideas and hallucinations.  All other systems reviewed and are  negative.  Physical Exam: Blood pressure 130/90, pulse (!) 114, height 5\' 6"  (1.676 m), weight 165 lb (74.8 kg), SpO2 95 %. Gen:      No acute distress HEENT:  EOMI, sclera anicteric Neck:     No masses; no thyromegaly Lungs:    Clear to auscultation bilaterally; normal respiratory effort CV:         Regular rate and rhythm; no murmurs Abd:      + bowel sounds; soft, non-tender; no palpable masses, no distension Ext:    No  edema; adequate peripheral perfusion Skin:      Warm and dry; no rash Neuro: alert and oriented x 3 Psych: normal mood and affect  Data Reviewed: FENO 04/14/89-11  Spirometry 02/13/16 FVC 2.81 [84%], FEV1 2.54 [92%], F/F 90% No obstruction  PFTs 11/14/16 FVC 3.12 [81%], FEV1 2.79 [90%], F/F 89, TLC 87%, DLCO 91% Normal study  CT chest 10/17/16-normal lungs.  Blood allergy profile 09/18/16-IgE 7, sensitive to grass CBC 09/18/16-WBC 8.4, eos 0.7%, absolute eosinophil count 100   Assessment:  Severe persistent asthma Continues on Symbicort, Xopenex as needed. Continue regimen for allergic rhinitis.  If his symptoms continue then we may need to reconsider reevaluation by allergy and desensitization Recommended saline nasal sprays.  Start Tessalon as needed for cough  Plan/Recommendations: - Continue Symbicort, Xopenex - Antihistamine, steroid and saline nasal spray -Tessalon for cough.  Marshell Garfinkel MD [facility] 04/14/2018, 3:06 PM  CC: Tenna Delaine D, PA*

## 2018-06-17 IMAGING — CT CT CHEST W/O CM
2 of 3 series · 15 of 36 positions shown, 18 images · non-contrast
Comparison: 07/30/2016

CLINICAL DATA: Intermittent cough for several months, history of
prior tobacco use

EXAM:
CT CHEST WITHOUT CONTRAST
TECHNIQUE: Multidetector CT imaging of the chest was performed following the
standard protocol without IV contrast.

[Series 2: thorax · axial · 0.74mm/px · z∈[-288,-38]mm · 12 of 147 slices shown, 15 images]
[im 11/147  mediastinal]
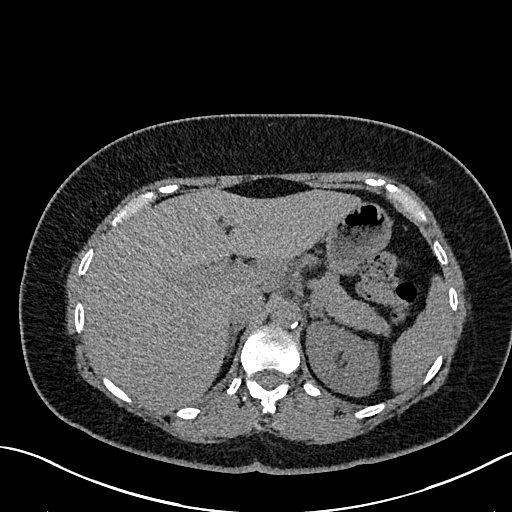
[im 11/147  lung]
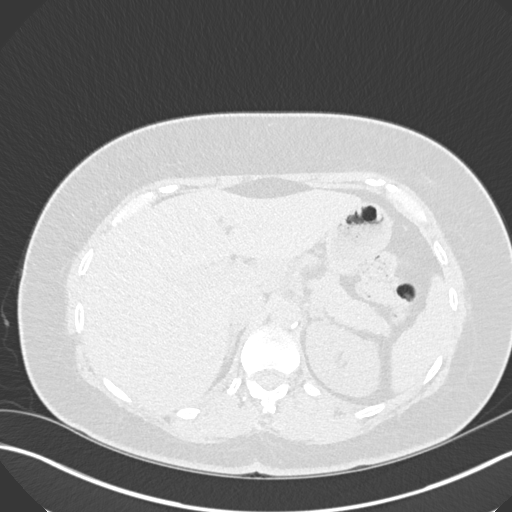
[im 22/147  lung]
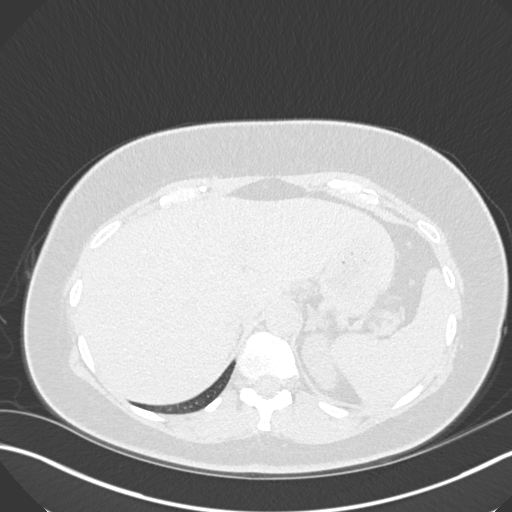
[im 33/147  lung]
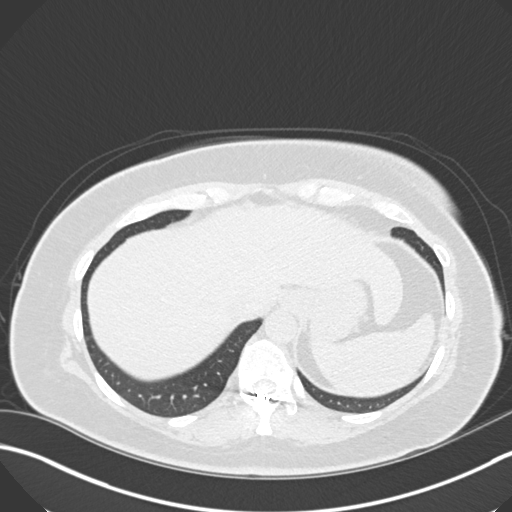
[im 44/147  lung]
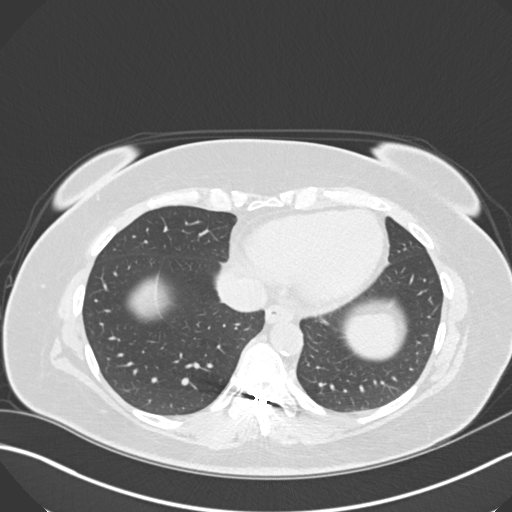
[im 55/147  mediastinal]
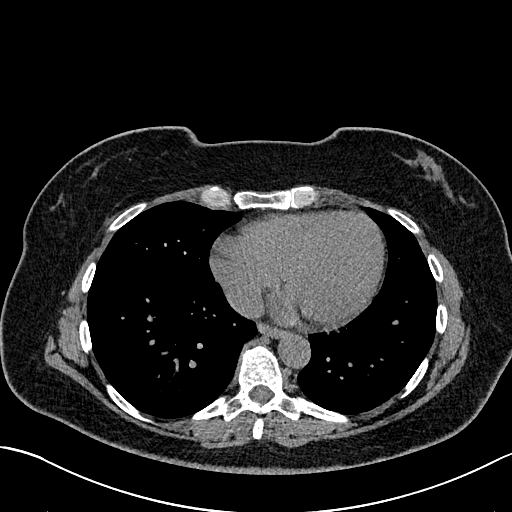
[im 55/147  lung]
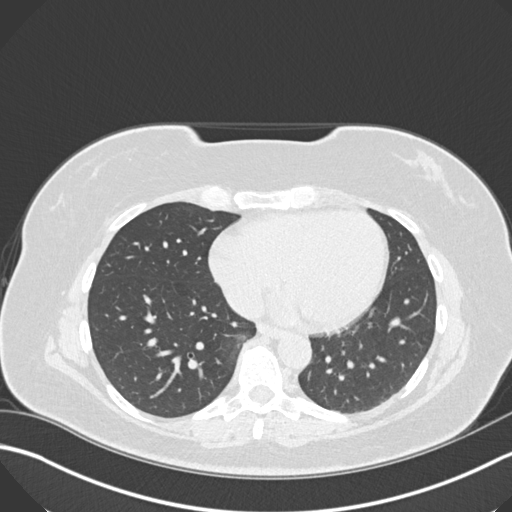
[im 65/147  lung]
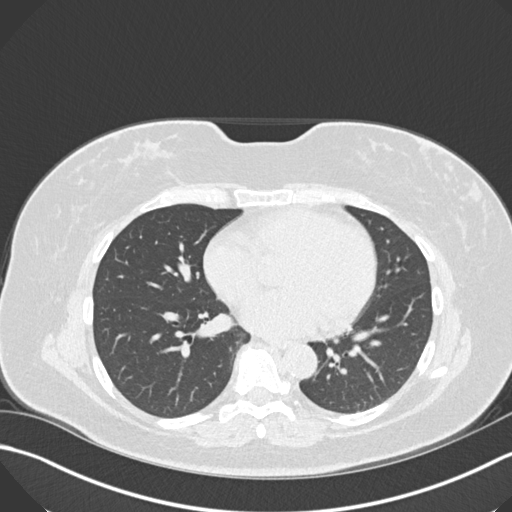
[im 82/147  lung]
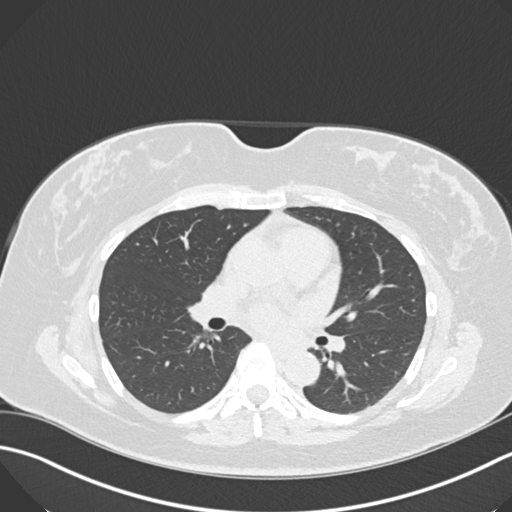
[im 92/147  lung]
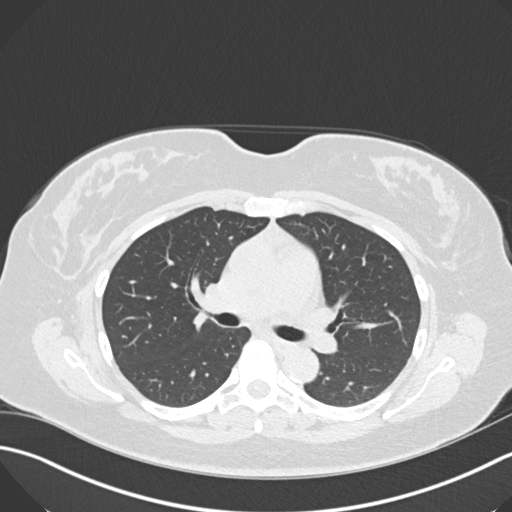
[im 103/147  mediastinal]
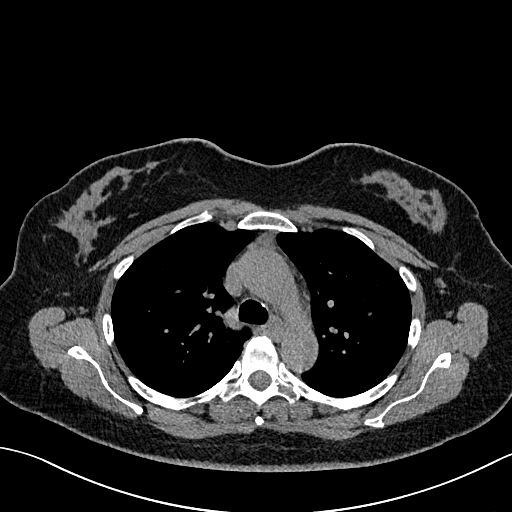
[im 103/147  lung]
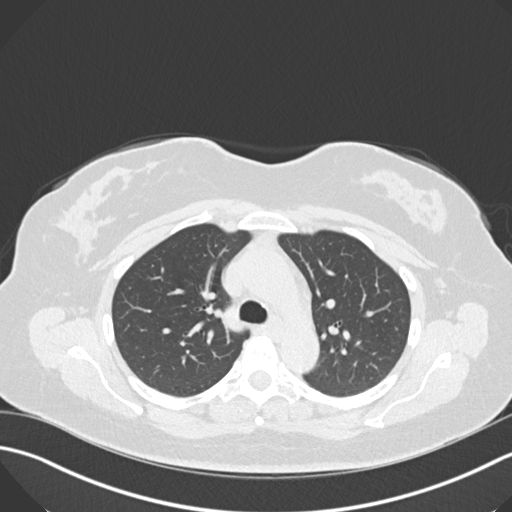
[im 114/147  lung]
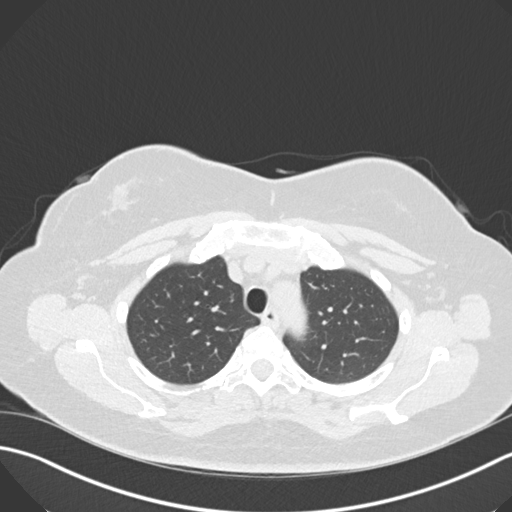
[im 125/147  lung]
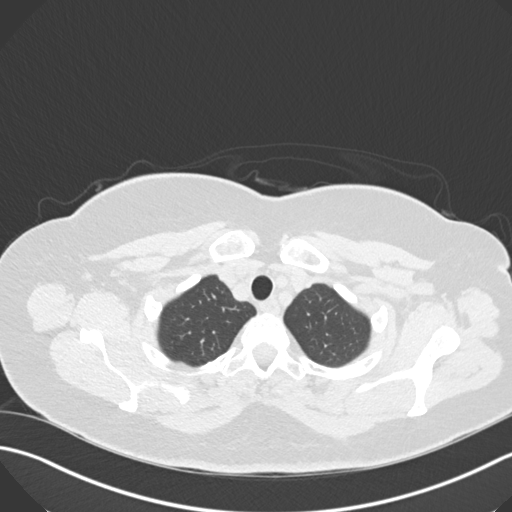
[im 136/147  lung]
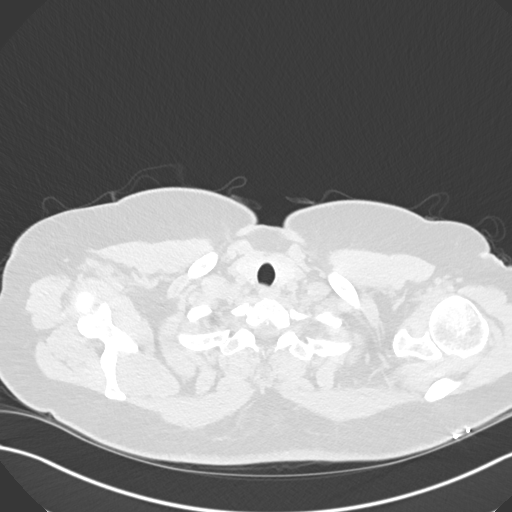

[Series 5: coronal · coronal · 0.59mm/px · 3 of 108 slices shown]
[im 22/108  lung]
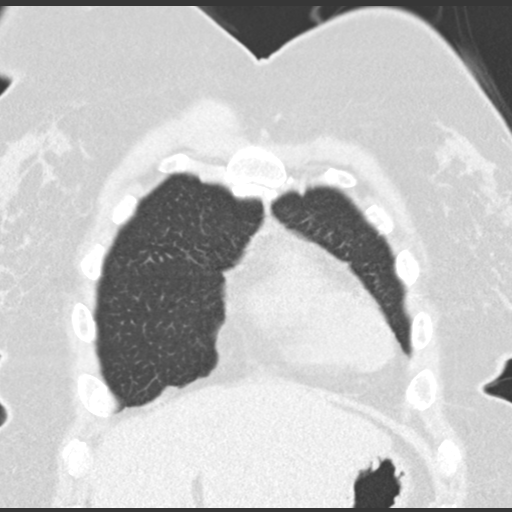
[im 43/108  lung]
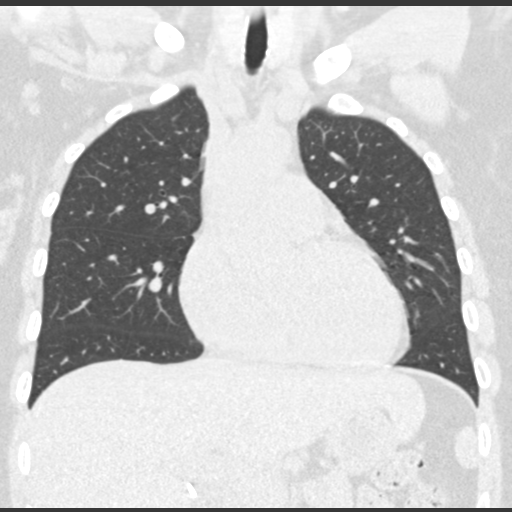
[im 65/108  lung]
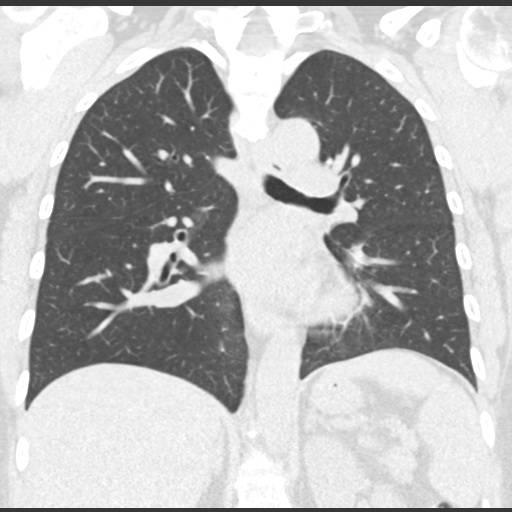

[15 of 36 positions shown; findings below may reference images not displayed]

FINDINGS: Cardiovascular: Somewhat limited due the lack of IV contrast. No
aneurysmal dilatation is seen. Mild aortic calcifications are noted.
The cardiac structures are within normal limits. No coronary
calcifications are seen.

Mediastinum/Nodes: The thoracic inlet demonstrates a tiny less than
1 cm hypodensity within the right lobe of the thyroid. No
significant hilar or mediastinal adenopathy is noted. No axillary
adenopathy is seen. The esophagus as visualize is within normal
limits.

Lungs/Pleura: Lungs are clear. No pleural effusion or pneumothorax.

Upper Abdomen: No acute abnormality.

Musculoskeletal: No chest wall mass or suspicious bone lesions
identified. Spinal stimulator is again noted.
IMPRESSION: Tiny right thyroid nodule measuring less than 1 cm.

No acute abnormality is noted within the chest. No findings to
correspond with the patient's given clinical history are noted.

## 2018-08-11 DIAGNOSIS — L409 Psoriasis, unspecified: Secondary | ICD-10-CM | POA: Diagnosis not present

## 2018-08-12 DIAGNOSIS — L400 Psoriasis vulgaris: Secondary | ICD-10-CM | POA: Diagnosis not present

## 2018-09-08 DIAGNOSIS — D485 Neoplasm of uncertain behavior of skin: Secondary | ICD-10-CM | POA: Diagnosis not present

## 2018-09-08 DIAGNOSIS — L723 Sebaceous cyst: Secondary | ICD-10-CM | POA: Diagnosis not present

## 2018-12-15 ENCOUNTER — Ambulatory Visit: Payer: 59 | Admitting: Pulmonary Disease

## 2019-01-20 ENCOUNTER — Ambulatory Visit (INDEPENDENT_AMBULATORY_CARE_PROVIDER_SITE_OTHER): Payer: 59 | Admitting: Pulmonary Disease

## 2019-01-20 ENCOUNTER — Encounter: Payer: Self-pay | Admitting: Pulmonary Disease

## 2019-01-20 VITALS — BP 128/78 | HR 92 | Ht 66.0 in | Wt 165.6 lb

## 2019-01-20 DIAGNOSIS — J4540 Moderate persistent asthma, uncomplicated: Secondary | ICD-10-CM | POA: Diagnosis not present

## 2019-01-20 DIAGNOSIS — R05 Cough: Secondary | ICD-10-CM | POA: Diagnosis not present

## 2019-01-20 DIAGNOSIS — R059 Cough, unspecified: Secondary | ICD-10-CM

## 2019-01-20 DIAGNOSIS — R0602 Shortness of breath: Secondary | ICD-10-CM

## 2019-01-20 DIAGNOSIS — J4520 Mild intermittent asthma, uncomplicated: Secondary | ICD-10-CM

## 2019-01-20 LAB — NITRIC OXIDE: Nitric Oxide: 7

## 2019-01-20 MED ORDER — BUDESONIDE-FORMOTEROL FUMARATE 160-4.5 MCG/ACT IN AERO: 2.0000 | INHALATION_SPRAY | Freq: Two times a day (BID) | RESPIRATORY_TRACT | 3 refills | Status: DC

## 2019-01-20 MED ORDER — LEVALBUTEROL TARTRATE 45 MCG/ACT IN AERO: 1.0000 | INHALATION_SPRAY | RESPIRATORY_TRACT | 3 refills | Status: DC | PRN

## 2019-01-20 MED ORDER — LEVALBUTEROL HCL 0.63 MG/3ML IN NEBU: 0.6300 mg | INHALATION_SOLUTION | Freq: Once | RESPIRATORY_TRACT | 6 refills | Status: DC

## 2019-01-20 NOTE — Progress Notes (Signed)
Christine Miranda    [id number]    1971-09-17  Primary Care Physician:Wiseman, Reather Laurence, PA-C  Referring Physician: Leonie Douglas, PA-C [address] [place], [place] [address]  Chief complaint:   Follow-up for moderate persistent asthma, allergic rhinitis  HPI: 48 year old with history of psoriasis on Humira therapy, severe asthma, allergic rhinitis.  Previously followed by Dr. Ashok Cordia She is maintained on Symbicort, Xopenex nebs.  For the past 2 weeks she reports worsening symptoms related to change in season She is using her rescue medication up to 6 times a day.  Denies any nighttime awakening, sputum production, fevers, chills  She has history of chronic seasonal rhinitis.  Previously underwent testing but unable to afford desensitization due to high deductible.  She is on a regimen of Astelin, Flonase, Zyrtec, Singulair  Pets: Has a cat, no dogs, birds, farm animals Occupation: [employer] Exposures: exposed to cleaning products.  No dampness, mold, hot tub, Jacuzzi, humidifier Smoking history: 2.5-pack-year history.  Quit in 1990 Travel history: Significant travel Relevant family history: No significant family history of lung disease  Interim history: States that breathing is up-and-down.  Reports mild increase in nonproductive cough She is under stress as she is in the middle of a messy divorce. She is afraid that she will lose her insurance due to the divorce  Outpatient Encounter Medications as of 01/20/2019  Medication Sig  . Adalimumab (HUMIRA) 40 MG/0.8ML PSKT Inject 40 mLs into the skin every 14 (fourteen) days.  . Azelastine-Fluticasone (DYMISTA) 137-50 MCG/ACT SUSP Place 2 puffs into the nose daily.  . Azelastine-Fluticasone 137-50 MCG/ACT SUSP Place 2 sprays into the nose 2 (two) times daily.  . benzonatate (TESSALON) 200 MG capsule Take 1 capsule (200 mg total) by mouth 3 (three) times daily as needed for cough.  . cetirizine (ZYRTEC) 10  MG tablet Take 1 tablet (10 mg total) by mouth daily.  Marland Kitchen EPINEPHrine (EPIPEN 2-PAK) 0.3 mg/0.3 mL IJ SOAJ injection Inject into the muscle once.  . levalbuterol (XOPENEX HFA) 45 MCG/ACT inhaler Inhale 1-2 puffs into the lungs every 4 (four) hours as needed for wheezing.  . montelukast (SINGULAIR) 10 MG tablet Take 1 tablet (10 mg total) by mouth at bedtime.  Marland Kitchen Spacer/Aero-Holding Chambers (AEROCHAMBER Z-STAT PLUS CHAMBR) MISC Use as directed  . budesonide-formoterol (SYMBICORT) 160-4.5 MCG/ACT inhaler Inhale 2 puffs into the lungs 2 (two) times daily. (Patient not taking: Reported on 01/20/2019)  . levalbuterol (XOPENEX) 0.63 MG/3ML nebulizer solution Take 3 mLs (0.63 mg total) by nebulization once.  . [DISCONTINUED] methocarbamol (ROBAXIN) 750 MG tablet Take 750 mg by mouth 4 (four) times daily as needed (for muscle spasms).   No facility-administered encounter medications on file as of 01/20/2019.    Physical Exam: Blood pressure 130/90, pulse (!) 114, height 5\' 6"  (1.676 m), weight 165 lb (74.8 kg), SpO2 95 %. Gen:      No acute distress HEENT:  EOMI, sclera anicteric Neck:     No masses; no thyromegaly Lungs:    Clear to auscultation bilaterally; normal respiratory effort CV:         Regular rate and rhythm; no murmurs Abd:      + bowel sounds; soft, non-tender; no palpable masses, no distension Ext:    No edema; adequate peripheral perfusion Skin:      Warm and dry; no rash Neuro: alert and oriented x 3 Psych: normal mood and affect  Data Reviewed: FENO 04/14/89-11  Spirometry 02/13/16 FVC 2.81 [84%], FEV1 2.54 [92%], F/F 90% No obstruction  PFTs 11/14/16 FVC 3.12 [81%], FEV1 2.79 [90%], F/F 89, TLC 87%, DLCO 91% Normal study  CT chest 10/17/16-normal lungs.  Blood allergy profile 09/18/16-IgE 7, sensitive to grass CBC 09/18/16-WBC 8.4, eos 0.7%, absolute eosinophil count 100  Assessment:  Moderate persistent asthma She uses the Symbicort as needed.  She stops taking it  during the winter months as symptoms typically get better I told her that biologics may be an option in the future but we would like her to be compliant with her inhaler therapy before considering this step and also she needs to be on good insurance Reassess at return visit.  Continue regimen for allergic rhinitis.  If his symptoms continue then we may need to reconsider reevaluation by allergy and desensitization Recommended saline nasal sprays.    Plan/Recommendations: - Continue Symbicort, Xopenex - Antihistamine, steroid and saline nasal spray -Tessalon for cough.  Marshell Garfinkel MD [facility] 01/20/2019, 3:45 PM  CC: Tenna Delaine D, PA*

## 2019-01-20 NOTE — Patient Instructions (Signed)
Continue using your inhalers as prescribed.  Use it daily for better control of symptoms Continue your antiallergy medications Follow-up in 6 months

## 2020-07-18 ENCOUNTER — Ambulatory Visit (HOSPITAL_COMMUNITY): Payer: Self-pay

## 2022-05-31 ENCOUNTER — Ambulatory Visit (INDEPENDENT_AMBULATORY_CARE_PROVIDER_SITE_OTHER): Payer: 59 | Admitting: Family Medicine

## 2022-05-31 ENCOUNTER — Encounter: Payer: Self-pay | Admitting: Family Medicine

## 2022-05-31 VITALS — BP 160/100 | HR 97 | Temp 96.3°F | Ht 65.0 in | Wt 175.0 lb

## 2022-05-31 DIAGNOSIS — E781 Pure hyperglyceridemia: Secondary | ICD-10-CM | POA: Diagnosis not present

## 2022-05-31 DIAGNOSIS — M5442 Lumbago with sciatica, left side: Secondary | ICD-10-CM | POA: Diagnosis not present

## 2022-05-31 DIAGNOSIS — E042 Nontoxic multinodular goiter: Secondary | ICD-10-CM

## 2022-05-31 DIAGNOSIS — R69 Illness, unspecified: Secondary | ICD-10-CM | POA: Diagnosis not present

## 2022-05-31 DIAGNOSIS — F909 Attention-deficit hyperactivity disorder, unspecified type: Secondary | ICD-10-CM | POA: Diagnosis not present

## 2022-05-31 DIAGNOSIS — F320 Major depressive disorder, single episode, mild: Secondary | ICD-10-CM | POA: Diagnosis not present

## 2022-05-31 DIAGNOSIS — G8929 Other chronic pain: Secondary | ICD-10-CM

## 2022-05-31 DIAGNOSIS — R030 Elevated blood-pressure reading, without diagnosis of hypertension: Secondary | ICD-10-CM

## 2022-05-31 DIAGNOSIS — Z780 Asymptomatic menopausal state: Secondary | ICD-10-CM | POA: Diagnosis not present

## 2022-05-31 DIAGNOSIS — L409 Psoriasis, unspecified: Secondary | ICD-10-CM

## 2022-05-31 DIAGNOSIS — M5441 Lumbago with sciatica, right side: Secondary | ICD-10-CM | POA: Diagnosis not present

## 2022-05-31 DIAGNOSIS — R635 Abnormal weight gain: Secondary | ICD-10-CM | POA: Diagnosis not present

## 2022-05-31 LAB — POCT GLYCOSYLATED HEMOGLOBIN (HGB A1C): Hemoglobin A1C: 5.3 % (ref 4.0–5.6)

## 2022-05-31 MED ORDER — BUPROPION HCL ER (XL) 300 MG PO TB24: 300.0000 mg | ORAL_TABLET | Freq: Every day | ORAL | 1 refills | Status: DC

## 2022-05-31 NOTE — Assessment & Plan Note (Signed)
Recheck thyroid given recent weight gain.

## 2022-05-31 NOTE — Progress Notes (Signed)
Subjective:     Christine Miranda is a 51 y.o. female presenting for Establish Care and Obesity (Gained 25lbs in last 4 months, despite changing diet and cutting out alcohol. Concerned with having diabetes. )     HPI  #Obesity - walking 4-5 miles per day - cut out alcohol and healthy diet - 6 months ago - 140-150 lbs is baseline - has weigh 220 lb in the past - typically will gain weight and lose quickly - 6 months ago weighed 160 lbs  - cut out alcohol initially w/o success and eating out and still gained through this  - tries to keep 1500-1700 calorie diet -   #hypertriglyceridemia - taking fish oil - did eat this morning  #Depression - on bupropion  - is having trouble sleeping - did reach remission with the bupropion  - over the last month has not been working as well  #multinodule goiter - normal tsh last year - weight gain - has psoriasis - does sweats frequently - chronic constipation  #HTN - always high in the doctors  Review of Systems   Social History   Tobacco Use  Smoking Status Former   Packs/day: 0.50   Years: 5.00   Pack years: 2.50   Types: Cigarettes   Quit date: 12/31/1988   Years since quitting: 33.4  Smokeless Tobacco Never  Tobacco Comments   Stepfather for 5 years         Objective:    BP Readings from Last 3 Encounters:  05/31/22 (!) 160/100  01/20/19 128/78  04/14/18 130/90   Wt Readings from Last 3 Encounters:  05/31/22 175 lb (79.4 kg)  01/20/19 165 lb 9.6 oz (75.1 kg)  04/14/18 165 lb (74.8 kg)    BP (!) 160/100   Pulse 97   Temp (!) 96.3 F (35.7 C) (Temporal)   Ht '5\' 5"'$  (1.651 m)   Wt 175 lb (79.4 kg)   SpO2 98%   BMI 29.12 kg/m    Physical Exam Constitutional:      General: She is not in acute distress.    Appearance: She is well-developed. She is not diaphoretic.  HENT:     Right Ear: External ear normal.     Left Ear: External ear normal.     Nose: Nose normal.  Eyes:     Conjunctiva/sclera:  Conjunctivae normal.  Cardiovascular:     Rate and Rhythm: Normal rate and regular rhythm.  Pulmonary:     Effort: Pulmonary effort is normal.  Musculoskeletal:     Cervical back: Neck supple.  Skin:    General: Skin is warm and dry.     Capillary Refill: Capillary refill takes less than 2 seconds.  Neurological:     Mental Status: She is alert. Mental status is at baseline.  Psychiatric:        Mood and Affect: Mood normal.        Behavior: Behavior normal.      05/31/2022    9:17 AM 10/19/2016    5:57 PM 08/06/2016    8:44 AM  Depression screen PHQ 2/9  Decreased Interest 2 0 0  Down, Depressed, Hopeless 0 0 0  PHQ - 2 Score 2 0 0  Altered sleeping 2    Tired, decreased energy 2    Change in appetite 2    Feeling bad or failure about yourself  0    Trouble concentrating 3    Moving slowly or fidgety/restless 0  Suicidal thoughts 0    PHQ-9 Score 11    Difficult doing work/chores Not difficult at all             Assessment & Plan:   Problem List Items Addressed This Visit       Cardiovascular and Mediastinum   White coat syndrome without diagnosis of hypertension    Pt notes always elevated at the doctors. Will continue to monitor.          Endocrine   Multinodular goiter    Recheck thyroid given recent weight gain.        Relevant Orders   TSH     Musculoskeletal and Integument   Psoriasis - Primary    Stable on Skyrizi. Follows with derm       Relevant Orders   Comprehensive metabolic panel     Other   Chronic back pain    Referral to pain to help support ongoing stimulator care. Cont prn robaxin.        Relevant Medications   methocarbamol (ROBAXIN) 500 MG tablet   buPROPion (WELLBUTRIN XL) 300 MG 24 hr tablet   Other Relevant Orders   Ambulatory referral to Pain Clinic   Hypertriglyceridemia   Relevant Orders   Lipid panel   Depression, major, single episode, mild (HCC)    On bupropion 12 hour but taking 2 pills in the AM. Has  noticed worsening depression recently. Switch to Bupropion long acting.  If no improvement increase from 300>450 mg.        Relevant Medications   buPROPion (WELLBUTRIN XL) 300 MG 24 hr tablet   Other Relevant Orders   CBC   Vitamin D, 25-hydroxy   ADHD    Was put on bupropion for depression/adhd/tobacco cessation. Some improvement in symptoms.        Relevant Medications   buPROPion (WELLBUTRIN XL) 300 MG 24 hr tablet   Weight gain, abnormal    Weight gain in the setting of diet/exercise changes. Discussed referral to nutrition which was placed. Discussed metabolism changes with age and weight loss. Labs to evaluate for other contributing factors.        Relevant Orders   POCT glycosylated hemoglobin (Hb A1C) (Completed)   Ambulatory referral to Nutrition and Diabetic Education   Comprehensive metabolic panel   TSH   Lipid panel   CBC   Vitamin D, 25-hydroxy   Menopause    Hot flashes, insomnia, mood changes. Briefly discussed hormones could help. She will do some reading and return if she wants to start hormones.          Return in about 6 weeks (around 07/12/2022) for hormones and depression.  Lesleigh Noe, MD

## 2022-05-31 NOTE — Assessment & Plan Note (Addendum)
On bupropion 12 hour but taking 2 pills in the AM. Has noticed worsening depression recently. Switch to Bupropion long acting.  If no improvement increase from 300>450 mg.

## 2022-05-31 NOTE — Assessment & Plan Note (Signed)
Stable on Skyrizi. Follows with derm

## 2022-05-31 NOTE — Assessment & Plan Note (Signed)
Weight gain in the setting of diet/exercise changes. Discussed referral to nutrition which was placed. Discussed metabolism changes with age and weight loss. Labs to evaluate for other contributing factors.

## 2022-05-31 NOTE — Assessment & Plan Note (Signed)
Hot flashes, insomnia, mood changes. Briefly discussed hormones could help. She will do some reading and return if she wants to start hormones.

## 2022-05-31 NOTE — Assessment & Plan Note (Signed)
Referral to pain to help support ongoing stimulator care. Cont prn robaxin.

## 2022-05-31 NOTE — Assessment & Plan Note (Signed)
Pt notes always elevated at the doctors. Will continue to monitor.

## 2022-05-31 NOTE — Assessment & Plan Note (Addendum)
Was put on bupropion for depression/adhd/tobacco cessation. Some improvement in symptoms.

## 2022-05-31 NOTE — Patient Instructions (Addendum)
Referral to pain and nutrition  #Referral I have placed a referral to a specialist for you. You should receive a phone call from the specialty office. Make sure your voicemail is not full and that if you are able to answer your phone to unknown or new numbers.   It may take up to 2 weeks to hear about the referral. If you do not hear anything in 2 weeks, please call our office and ask to speak with the referral coordinator.

## 2022-06-07 ENCOUNTER — Telehealth: Payer: Self-pay | Admitting: Family Medicine

## 2022-06-07 NOTE — Telephone Encounter (Signed)
Pt called about her new referral for chronic low back pain with bilateral sciatica, unspecified back pain laterality this referral was from [facility] for physical therapy. Pt states this referral is not right for her condition, please advise and give a call back when possible.   Callback Number: [phone]

## 2022-06-12 NOTE — Telephone Encounter (Signed)
Left a detailed message per DPR, asking pt to call back and let us know what referral should be put in for her condition.

## 2022-06-12 NOTE — Telephone Encounter (Signed)
Please see message thread about referral.   Refill last filled by historical provider: 04/04/22 Last OV: 05/31/22 No future appts scheduled

## 2022-06-12 NOTE — Telephone Encounter (Signed)
Referral to Pain clinic-not for PT Do not refer to [facility]   No [facility]  Referral needed to pain clinic so that they can call [facility] so that they can replace the batteries in her spinal cord stimulator (it is not currently working)  methocarbamol (ROBAXIN) 500 MG tablet  Needs a refill  [id number] [address] [place]:  [phone] Fax:  [phone]    No medicine left   [facility] [place] but [place] will not allow her to go there and they will not fill for her  Can follow-up with her on my chart

## 2022-06-12 NOTE — Addendum Note (Signed)
Addended by: Loreen Freud on: 06/12/2022 04:24 PM   Modules accepted: Orders

## 2022-06-13 ENCOUNTER — Other Ambulatory Visit: Payer: Self-pay | Admitting: Family Medicine

## 2022-06-13 ENCOUNTER — Other Ambulatory Visit (INDEPENDENT_AMBULATORY_CARE_PROVIDER_SITE_OTHER): Payer: 59

## 2022-06-13 DIAGNOSIS — E042 Nontoxic multinodular goiter: Secondary | ICD-10-CM

## 2022-06-13 DIAGNOSIS — R69 Illness, unspecified: Secondary | ICD-10-CM | POA: Diagnosis not present

## 2022-06-13 DIAGNOSIS — L409 Psoriasis, unspecified: Secondary | ICD-10-CM

## 2022-06-13 DIAGNOSIS — F320 Major depressive disorder, single episode, mild: Secondary | ICD-10-CM | POA: Diagnosis not present

## 2022-06-13 DIAGNOSIS — E781 Pure hyperglyceridemia: Secondary | ICD-10-CM

## 2022-06-13 DIAGNOSIS — R635 Abnormal weight gain: Secondary | ICD-10-CM

## 2022-06-13 DIAGNOSIS — E559 Vitamin D deficiency, unspecified: Secondary | ICD-10-CM

## 2022-06-13 LAB — COMPREHENSIVE METABOLIC PANEL
ALT: 24 U/L (ref 0–35)
AST: 19 U/L (ref 0–37)
Albumin: 4.3 g/dL (ref 3.5–5.2)
Alkaline Phosphatase: 85 U/L (ref 39–117)
BUN: 12 mg/dL (ref 6–23)
CO2: 27 mEq/L (ref 19–32)
Calcium: 9.5 mg/dL (ref 8.4–10.5)
Chloride: 103 mEq/L (ref 96–112)
Creatinine, Ser: 0.64 mg/dL (ref 0.40–1.20)
GFR: 101.76 mL/min (ref >60.00)
Glucose, Bld: 107 mg/dL — ABNORMAL HIGH (ref 70–99)
Potassium: 3.7 mEq/L (ref 3.5–5.1)
Sodium: 139 mEq/L (ref 135–145)
Total Bilirubin: 0.8 mg/dL (ref 0.2–1.2)
Total Protein: 7.2 g/dL (ref 6.0–8.3)

## 2022-06-13 LAB — LIPID PANEL
Cholesterol: 160 mg/dL (ref 0–200)
HDL: 39.7 mg/dL (ref >39.00)
NonHDL: 120.69
Total CHOL/HDL Ratio: 4
Triglycerides: 269 mg/dL — ABNORMAL HIGH (ref 0.0–149.0)
VLDL: 53.8 mg/dL — ABNORMAL HIGH (ref 0.0–40.0)

## 2022-06-13 LAB — CBC
HCT: 42.2 % (ref 36.0–46.0)
Hemoglobin: 14.7 g/dL (ref 12.0–15.0)
MCHC: 34.8 g/dL (ref 30.0–36.0)
MCV: 87.5 fl (ref 78.0–100.0)
Platelets: 225 10*3/uL (ref 150.0–400.0)
RBC: 4.82 Mil/uL (ref 3.87–5.11)
RDW: 13 % (ref 11.5–15.5)
WBC: 5.5 10*3/uL (ref 4.0–10.5)

## 2022-06-13 LAB — LDL CHOLESTEROL, DIRECT: Direct LDL: 86 mg/dL

## 2022-06-13 LAB — VITAMIN D 25 HYDROXY (VIT D DEFICIENCY, FRACTURES): VITD: 26.23 ng/mL — ABNORMAL LOW (ref 30.00–100.00)

## 2022-06-13 LAB — TSH: TSH: 2.51 u[IU]/mL (ref 0.35–5.50)

## 2022-06-13 MED ORDER — METHOCARBAMOL 500 MG PO TABS: 500.0000 mg | ORAL_TABLET | Freq: Three times a day (TID) | ORAL | 0 refills | Status: DC

## 2022-06-13 MED ORDER — VITAMIN D (ERGOCALCIFEROL) 1.25 MG (50000 UNIT) PO CAPS: 50000.0000 [IU] | ORAL_CAPSULE | ORAL | 1 refills | Status: DC

## 2022-06-13 NOTE — Telephone Encounter (Signed)
Patient was already referred to pain. Routing to referral coordinator to see if it was sent to PT on accident

## 2022-06-13 NOTE — Addendum Note (Signed)
Addended by: Lesleigh Noe on: 06/13/2022 07:47 AM   Modules accepted: Orders

## 2022-06-23 ENCOUNTER — Other Ambulatory Visit: Payer: Self-pay | Admitting: Family Medicine

## 2022-06-23 DIAGNOSIS — F909 Attention-deficit hyperactivity disorder, unspecified type: Secondary | ICD-10-CM

## 2022-06-23 DIAGNOSIS — F320 Major depressive disorder, single episode, mild: Secondary | ICD-10-CM

## 2022-06-26 DIAGNOSIS — F320 Major depressive disorder, single episode, mild: Secondary | ICD-10-CM

## 2022-06-27 MED ORDER — BUPROPION HCL ER (XL) 150 MG PO TB24: 450.0000 mg | ORAL_TABLET | Freq: Every day | ORAL | 1 refills | Status: DC

## 2022-06-28 NOTE — Telephone Encounter (Signed)
Another referral coordinator handled this referral. I am not sure why this was sent to PT and not Pain Medicine.   I Can send the referral to Pain Specialist in [facility]: [facility] in: [place] Address: [address] [place], [place] [address] Phone: [phone]  MyChart message sent to patient to see if she is okay with this location.

## 2022-07-20 ENCOUNTER — Other Ambulatory Visit: Payer: Self-pay | Admitting: Family Medicine

## 2022-07-20 DIAGNOSIS — F320 Major depressive disorder, single episode, mild: Secondary | ICD-10-CM

## 2022-07-20 NOTE — Telephone Encounter (Signed)
Last filled on 06/27/22 Last ov 05/31/22  Pt is requesting a 90day supply

## 2022-08-20 ENCOUNTER — Ambulatory Visit: Payer: 59 | Admitting: Registered"

## 2022-09-10 ENCOUNTER — Ambulatory Visit: Payer: 59 | Admitting: Family Medicine

## 2022-09-12 ENCOUNTER — Ambulatory Visit (INDEPENDENT_AMBULATORY_CARE_PROVIDER_SITE_OTHER): Payer: 59 | Admitting: Family Medicine

## 2022-09-12 VITALS — BP 190/110 | HR 80 | Temp 97.6°F | Wt 174.2 lb

## 2022-09-12 DIAGNOSIS — G8929 Other chronic pain: Secondary | ICD-10-CM

## 2022-09-12 DIAGNOSIS — M5441 Lumbago with sciatica, right side: Secondary | ICD-10-CM

## 2022-09-12 DIAGNOSIS — I10 Essential (primary) hypertension: Secondary | ICD-10-CM

## 2022-09-12 DIAGNOSIS — M5442 Lumbago with sciatica, left side: Secondary | ICD-10-CM | POA: Diagnosis not present

## 2022-09-12 LAB — CBC
HCT: 41.8 % (ref 36.0–46.0)
Hemoglobin: 14.4 g/dL (ref 12.0–15.0)
MCHC: 34.4 g/dL (ref 30.0–36.0)
MCV: 89.2 fl (ref 78.0–100.0)
Platelets: 212 10*3/uL (ref 150.0–400.0)
RBC: 4.69 Mil/uL (ref 3.87–5.11)
RDW: 13.1 % (ref 11.5–15.5)
WBC: 4.5 10*3/uL (ref 4.0–10.5)

## 2022-09-12 LAB — COMPREHENSIVE METABOLIC PANEL
ALT: 26 U/L (ref 0–35)
AST: 21 U/L (ref 0–37)
Albumin: 4.1 g/dL (ref 3.5–5.2)
Alkaline Phosphatase: 92 U/L (ref 39–117)
BUN: 12 mg/dL (ref 6–23)
CO2: 24 mEq/L (ref 19–32)
Calcium: 9.3 mg/dL (ref 8.4–10.5)
Chloride: 104 mEq/L (ref 96–112)
Creatinine, Ser: 0.61 mg/dL (ref 0.40–1.20)
GFR: 102.76 mL/min (ref >60.00)
Glucose, Bld: 108 mg/dL — ABNORMAL HIGH (ref 70–99)
Potassium: 3.9 mEq/L (ref 3.5–5.1)
Sodium: 139 mEq/L (ref 135–145)
Total Bilirubin: 0.7 mg/dL (ref 0.2–1.2)
Total Protein: 7 g/dL (ref 6.0–8.3)

## 2022-09-12 LAB — POC URINALSYSI DIPSTICK (AUTOMATED)
Bilirubin, UA: NEGATIVE
Blood, UA: NEGATIVE
Glucose, UA: NEGATIVE
Ketones, UA: NEGATIVE
Leukocytes, UA: NEGATIVE
Nitrite, UA: NEGATIVE
Protein, UA: NEGATIVE
Spec Grav, UA: 1.01 (ref 1.010–1.025)
Urobilinogen, UA: 0.2 E.U./dL
pH, UA: 6 (ref 5.0–8.0)

## 2022-09-12 LAB — BRAIN NATRIURETIC PEPTIDE: Pro B Natriuretic peptide (BNP): 23 pg/mL (ref 0.0–100.0)

## 2022-09-12 LAB — TSH: TSH: 1.96 u[IU]/mL (ref 0.35–5.50)

## 2022-09-12 MED ORDER — METHOCARBAMOL 500 MG PO TABS: 500.0000 mg | ORAL_TABLET | Freq: Three times a day (TID) | ORAL | 0 refills | Status: DC

## 2022-09-12 MED ORDER — LOSARTAN POTASSIUM-HCTZ 50-12.5 MG PO TABS: 1.0000 | ORAL_TABLET | Freq: Every day | ORAL | 1 refills | Status: DC

## 2022-09-12 NOTE — Addendum Note (Signed)
Addended by: Waunita Schooner R on: 09/12/2022 10:24 AM   Modules accepted: Orders

## 2022-09-12 NOTE — Progress Notes (Signed)
Subjective:     Christine Miranda is a 51 y.o. female presenting for Hypertension (X 2 weeks )     HPI  #HTN - went to donate blood and they sent her home - has been monitoring at home for 2 weeks 140-200/90-110 - no cp, vision changes - did start having HA - like a throbbing or pressure sensation - has been happening on and off through - has been checking bp while laying down - is having some Right sided abdominal pain - does endorse some SOB - dyspnea on exertion  - the DOE has improved some  - she is able to go up and down stairs with less problems now - no weakness - occasional tingling in the arms but nothing persistent  Review of Systems   Social History   Tobacco Use  Smoking Status Former   Packs/day: 0.50   Years: 5.00   Total pack years: 2.50   Types: Cigarettes   Quit date: 12/31/1988   Years since quitting: 33.7  Smokeless Tobacco Never  Tobacco Comments   Stepfather for 5 years         Objective:    BP Readings from Last 3 Encounters:  09/12/22 (!) 190/110  05/31/22 (!) 160/100  01/20/19 128/78   Wt Readings from Last 3 Encounters:  09/12/22 174 lb 4 oz (79 kg)  05/31/22 175 lb (79.4 kg)  01/20/19 165 lb 9.6 oz (75.1 kg)    BP (!) 190/110   Pulse 80   Temp 97.6 F (36.4 C) (Temporal)   Wt 174 lb 4 oz (79 kg)   SpO2 98%   BMI 29.00 kg/m    Physical Exam Constitutional:      General: She is not in acute distress.    Appearance: She is well-developed. She is not diaphoretic.  HENT:     Head: Normocephalic and atraumatic.     Right Ear: External ear normal.     Left Ear: External ear normal.     Nose: Nose normal.     Mouth/Throat:     Mouth: Mucous membranes are moist.  Eyes:     Extraocular Movements: Extraocular movements intact.     Conjunctiva/sclera: Conjunctivae normal.     Pupils: Pupils are equal, round, and reactive to light.  Cardiovascular:     Rate and Rhythm: Normal rate and regular rhythm.     Heart  sounds: No murmur heard. Pulmonary:     Effort: Pulmonary effort is normal. No respiratory distress.     Breath sounds: Normal breath sounds. No wheezing or rales.  Musculoskeletal:     Cervical back: Neck supple.  Skin:    General: Skin is warm and dry.     Capillary Refill: Capillary refill takes less than 2 seconds.  Neurological:     Mental Status: She is alert. Mental status is at baseline.  Psychiatric:        Mood and Affect: Mood normal.        Behavior: Behavior normal.           Assessment & Plan:   Problem List Items Addressed This Visit       Cardiovascular and Mediastinum   Primary hypertension - Primary    C/b headache. EKG reassuring. Strict ER precautions for CP or stroke like symptoms. Start losartan-HCTZ. Update in 1 week with home monitoring. Return in 2 weeks. Labs today.       Relevant Medications   losartan-hydrochlorothiazide (HYZAAR) 50-12.5 MG tablet  Other Relevant Orders   EKG 12-Lead (Completed)   Comprehensive metabolic panel   CBC   TSH   POCT urinalysis dipstick     Other   Chronic back pain    No acute pain today. Has been avoiding NSAIDs 2/2 to HTN. Cont robaxin. Referral to ortho to get stimulator battery changed.       Relevant Medications   methocarbamol (ROBAXIN) 500 MG tablet   Other Relevant Orders   Ambulatory referral to Orthopedic Surgery     Return in about 2 weeks (around 09/26/2022) for BP check.  Lesleigh Noe, MD

## 2022-09-12 NOTE — Patient Instructions (Addendum)
See me in 2 weeks Schedule with TOC Tabitha in 6-8 weeks   Your blood pressure high.   Start Losartan-HCTZ Monitor blood pressure If tolerating and blood pressure still high   High blood pressure increases your risk for heart attack and stroke.    Please check your blood pressure 2-4 times a week.   To check your blood pressure 1) Sit in a quiet and relaxed place for 5 minutes 2) Make sure your feet are flat on the ground 3) Consider checking first thing in the morning   Normal blood pressure is less than 140/90 Ideally you blood pressure should be around 120/80  Other ways you can reduce your blood pressure:  1) Regular exercise -- Try to get 150 minutes (30 minutes, 5 days a week) of moderate to vigorous aerobic excercise -- Examples: brisk walking (2.5 miles per hour), water aerobics, dancing, gardening, tennis, biking slower than 10 miles per hour 2) DASH Diet - low fat meats, more fresh fruits and vegetables, whole grains, low salt 3) Quit smoking if you smoke 4) Loose 5-10% of your body weight

## 2022-09-12 NOTE — Assessment & Plan Note (Signed)
No acute pain today. Has been avoiding NSAIDs 2/2 to HTN. Cont robaxin. Referral to ortho to get stimulator battery changed.

## 2022-09-12 NOTE — Assessment & Plan Note (Signed)
C/b headache. EKG reassuring. Strict ER precautions for CP or stroke like symptoms. Start losartan-HCTZ. Update in 1 week with home monitoring. Return in 2 weeks. Labs today.

## 2022-09-12 NOTE — Addendum Note (Signed)
Addended by: Loreen Freud on: 09/12/2022 02:34 PM   Modules accepted: Orders

## 2022-09-18 ENCOUNTER — Encounter: Payer: Self-pay | Admitting: Family Medicine

## 2022-09-26 ENCOUNTER — Ambulatory Visit (INDEPENDENT_AMBULATORY_CARE_PROVIDER_SITE_OTHER): Payer: 59 | Admitting: Family Medicine

## 2022-09-26 ENCOUNTER — Encounter: Payer: Self-pay | Admitting: Family Medicine

## 2022-09-26 VITALS — BP 150/100 | Temp 97.2°F | Wt 174.4 lb

## 2022-09-26 DIAGNOSIS — I10 Essential (primary) hypertension: Secondary | ICD-10-CM

## 2022-09-26 DIAGNOSIS — Z20822 Contact with and (suspected) exposure to covid-19: Secondary | ICD-10-CM

## 2022-09-26 DIAGNOSIS — U071 COVID-19: Secondary | ICD-10-CM

## 2022-09-26 LAB — POC COVID19 BINAXNOW: SARS Coronavirus 2 Ag: POSITIVE — AB

## 2022-09-26 MED ORDER — PROMETHAZINE-DM 6.25-15 MG/5ML PO SYRP: 5.0000 mL | ORAL_SOLUTION | Freq: Four times a day (QID) | ORAL | 0 refills | Status: DC | PRN

## 2022-09-26 MED ORDER — LOSARTAN POTASSIUM-HCTZ 100-25 MG PO TABS: 1.0000 | ORAL_TABLET | Freq: Every day | ORAL | 0 refills | Status: DC

## 2022-09-26 MED ORDER — MOLNUPIRAVIR EUA 200MG CAPSULE: 4.0000 | ORAL_CAPSULE | Freq: Two times a day (BID) | ORAL | 0 refills | Status: AC

## 2022-09-26 NOTE — Progress Notes (Signed)
Subjective:     Christine Miranda is a 51 y.o. female presenting for Cough (Fever, body aches, fatigue, nasal congestion X 3 days)     Cough This is a new problem. Episode onset: 09/23/2022. The problem has been waxing and waning. The problem occurs constantly. The cough is Non-productive. Associated symptoms include chills, ear pain, a fever, myalgias and nasal congestion. Pertinent negatives include no shortness of breath or wheezing. Nothing aggravates the symptoms. She has tried a beta-agonist inhaler, OTC cough suppressant and rest for the symptoms. The treatment provided mild relief. Her past medical history is significant for asthma.     Review of Systems  Constitutional:  Positive for chills and fever.  HENT:  Positive for ear pain.   Respiratory:  Positive for cough. Negative for shortness of breath and wheezing.   Musculoskeletal:  Positive for myalgias.     Social History   Tobacco Use  Smoking Status Former   Packs/day: 0.50   Years: 5.00   Total pack years: 2.50   Types: Cigarettes   Quit date: 12/31/1988   Years since quitting: 33.7  Smokeless Tobacco Never  Tobacco Comments   Stepfather for 5 years         Objective:    BP Readings from Last 3 Encounters:  09/26/22 (!) 150/100  09/12/22 (!) 190/110  05/31/22 (!) 160/100   Wt Readings from Last 3 Encounters:  09/26/22 174 lb 6 oz (79.1 kg)  09/12/22 174 lb 4 oz (79 kg)  05/31/22 175 lb (79.4 kg)    BP (!) 150/100   Temp (!) 97.2 F (36.2 C) (Temporal)   Wt 174 lb 6 oz (79.1 kg)   BMI 29.02 kg/m    Physical Exam Constitutional:      General: She is not in acute distress.    Appearance: She is well-developed. She is not diaphoretic.  HENT:     Head: Normocephalic and atraumatic.     Right Ear: Ear canal normal. A middle ear effusion is present. Tympanic membrane is erythematous.     Left Ear: Tympanic membrane and ear canal normal.     Nose: Mucosal edema and rhinorrhea present.      Right Sinus: No maxillary sinus tenderness or frontal sinus tenderness.     Left Sinus: No maxillary sinus tenderness or frontal sinus tenderness.     Mouth/Throat:     Pharynx: Uvula midline. Posterior oropharyngeal erythema present. No oropharyngeal exudate.     Tonsils: 0 on the right. 0 on the left.  Eyes:     General: No scleral icterus.    Conjunctiva/sclera: Conjunctivae normal.  Cardiovascular:     Rate and Rhythm: Normal rate and regular rhythm.     Heart sounds: Normal heart sounds. No murmur heard. Pulmonary:     Effort: Pulmonary effort is normal. No respiratory distress.     Breath sounds: Normal breath sounds.  Musculoskeletal:     Cervical back: Neck supple.  Lymphadenopathy:     Cervical: No cervical adenopathy.  Skin:    General: Skin is warm and dry.     Capillary Refill: Capillary refill takes less than 2 seconds.  Neurological:     Mental Status: She is alert.           Assessment & Plan:   Problem List Items Addressed This Visit       Cardiovascular and Mediastinum   Primary hypertension    Improved from prior. Cont Losartan-HCTZ 100-25 mg. Return 1  month for BP check off cold medication      Relevant Medications   losartan-hydrochlorothiazide (HYZAAR) 100-25 MG tablet   Other Visit Diagnoses     COVID-19 virus infection    -  Primary   Relevant Medications   promethazine-dextromethorphan (PROMETHAZINE-DM) 6.25-15 MG/5ML syrup   molnupiravir EUA (LAGEVRIO) 200 mg CAPS capsule   Suspected COVID-19 virus infection       Relevant Orders   POC COVID-19 BinaxNow (Completed)      Patient is at increased risk for developing severe covid due to HTN, psoriasis on skyrizi. They are eligible for anti-viral medication.   Discussed molnupiravir due to skyrizi to avoid possible interaction  Lab Results  Component Value Date   ALT 26 09/12/2022   AST 21 09/12/2022   ALKPHOS 92 09/12/2022   BILITOT 0.7 09/12/2022    Lab Results  Component Value  Date   CREATININE 0.61 09/12/2022     Medication sent to pharmacy  Reviewed ER and return precautions  Reviewed isolation guidelines.    Return in about 4 weeks (around 10/24/2022) for BP.  Lesleigh Noe, MD

## 2022-09-26 NOTE — Patient Instructions (Signed)
Isolation - through 9/29 Mask 9/30-10/4 Resume normal activity 10/5 (if feeling better)   ER if severe symptoms  Call back Monday if ear pain not improving  Based on your symptoms, it looks like you have a virus.   Antibiotics are not need for a viral infection but the following will help:   Drink plenty of fluids Get lots of rest  Sinus Congestion 1) Neti Pot (Saline rinse) -- 2 times day -- if tolerated 2) Flonase (Store Brand ok) - once daily 3) Over the counter congestion medications  Cough 1) Cough drops can be helpful 2) Nyquil (or nighttime cough medication) 3) Honey is proven to be one of the best cough medications  4) Cough medicine with Dextromethorphan can also be helpful  Sore Throat 1) Honey as above, cough drops 2) Ibuprofen or Aleve can be helpful 3) Salt water Gargles  If you develop fevers (Temperature >100.4), chills, worsening symptoms or symptoms lasting longer than 10 days return to clinic.

## 2022-09-26 NOTE — Assessment & Plan Note (Signed)
Improved from prior. Cont Losartan-HCTZ 100-25 mg. Return 1 month for BP check off cold medication

## 2022-10-08 ENCOUNTER — Other Ambulatory Visit: Payer: Self-pay | Admitting: Family Medicine

## 2022-10-08 ENCOUNTER — Encounter: Payer: Self-pay | Admitting: Family

## 2022-10-08 DIAGNOSIS — G8929 Other chronic pain: Secondary | ICD-10-CM

## 2022-10-09 ENCOUNTER — Other Ambulatory Visit: Payer: Self-pay | Admitting: Family

## 2022-10-09 DIAGNOSIS — I10 Essential (primary) hypertension: Secondary | ICD-10-CM

## 2022-10-09 MED ORDER — AMLODIPINE BESYLATE 5 MG PO TABS: 5.0000 mg | ORAL_TABLET | Freq: Every day | ORAL | 1 refills | Status: DC

## 2022-11-02 ENCOUNTER — Encounter: Payer: Self-pay | Admitting: Family

## 2022-11-02 ENCOUNTER — Ambulatory Visit (INDEPENDENT_AMBULATORY_CARE_PROVIDER_SITE_OTHER): Payer: 59 | Admitting: Family

## 2022-11-02 VITALS — BP 145/100 | HR 84 | Temp 98.4°F | Resp 16 | Ht 65.0 in | Wt 178.5 lb

## 2022-11-02 DIAGNOSIS — Z780 Asymptomatic menopausal state: Secondary | ICD-10-CM

## 2022-11-02 DIAGNOSIS — R1013 Epigastric pain: Secondary | ICD-10-CM

## 2022-11-02 DIAGNOSIS — E663 Overweight: Secondary | ICD-10-CM

## 2022-11-02 DIAGNOSIS — R12 Heartburn: Secondary | ICD-10-CM | POA: Diagnosis not present

## 2022-11-02 DIAGNOSIS — I10 Essential (primary) hypertension: Secondary | ICD-10-CM

## 2022-11-02 DIAGNOSIS — M2550 Pain in unspecified joint: Secondary | ICD-10-CM | POA: Diagnosis not present

## 2022-11-02 DIAGNOSIS — R1012 Left upper quadrant pain: Secondary | ICD-10-CM | POA: Diagnosis not present

## 2022-11-02 LAB — LIPASE: Lipase: 71 U/L — ABNORMAL HIGH (ref 11.0–59.0)

## 2022-11-02 LAB — AMYLASE: Amylase: 31 U/L (ref 27–131)

## 2022-11-02 LAB — FOLLICLE STIMULATING HORMONE: FSH: 84.9 m[IU]/mL

## 2022-11-02 LAB — SEDIMENTATION RATE: Sed Rate: 11 mm/hr (ref 0–30)

## 2022-11-02 MED ORDER — AMLODIPINE BESYLATE 10 MG PO TABS: 10.0000 mg | ORAL_TABLET | Freq: Every day | ORAL | 0 refills | Status: DC

## 2022-11-02 MED ORDER — OMEPRAZOLE 20 MG PO CPDR: 20.0000 mg | DELAYED_RELEASE_CAPSULE | Freq: Every day | ORAL | 3 refills | Status: DC

## 2022-11-02 NOTE — Assessment & Plan Note (Addendum)
Try to decrease and or avoid spicy foods, fried fatty foods, and also caffeine and chocolate as these can increase heartburn symptoms.  Trial omeprazole rx 20 mg sent to pharmacy   Luq abd pain, alpha gal lipase amylase ordered and pending. Consider abd u/s if no improvement with trial omeprazole

## 2022-11-02 NOTE — Assessment & Plan Note (Signed)
Pt concerned over weight gain  Advised to switch up exercise routine and consider referral for weight loss medication/speak with nutritionist.
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
## 2022-11-02 NOTE — Patient Instructions (Addendum)
Trial omeprazole 20 mg once daily  Please start trial of omeprazole as prescribed. Take this medication for two weeks, administer 30 minutes prior to breakfast each am. Try to decrease and or avoid spicy foods, fried fatty foods, and also caffeine and chocolate as these can increase heartburn symptoms.   Stop by the lab prior to leaving today. I will notify you of your results once received.   Increase to amlodipine 10 mg once daily.    Regards,   Eugenia Pancoast FNP-C

## 2022-11-02 NOTE — Assessment & Plan Note (Signed)
Ana rf sed rate, ordered and pending.

## 2022-11-02 NOTE — Assessment & Plan Note (Signed)
Continue amlodipine 5 mg and losartan hctz 100-25 mg daily.

## 2022-11-05 ENCOUNTER — Other Ambulatory Visit: Payer: Self-pay

## 2022-11-05 ENCOUNTER — Other Ambulatory Visit: Payer: Self-pay | Admitting: Family

## 2022-11-05 DIAGNOSIS — R1012 Left upper quadrant pain: Secondary | ICD-10-CM

## 2022-11-05 DIAGNOSIS — R748 Abnormal levels of other serum enzymes: Secondary | ICD-10-CM

## 2022-11-05 DIAGNOSIS — M5451 Vertebrogenic low back pain: Secondary | ICD-10-CM | POA: Diagnosis not present

## 2022-11-05 DIAGNOSIS — M545 Low back pain, unspecified: Secondary | ICD-10-CM | POA: Insufficient documentation

## 2022-11-05 DIAGNOSIS — Z978 Presence of other specified devices: Secondary | ICD-10-CM | POA: Diagnosis not present

## 2022-11-05 LAB — ALPHA-GAL PANEL
Allergen, Mutton, f88: 0.1 kU/L — ABNORMAL HIGH
Allergen, Pork, f26: <0.1 kU/L
Beef: <0.1 kU/L
CLASS: 0
CLASS: 0
GALACTOSE-ALPHA-1,3-GALACTOSE IGE*: <0.1 kU/L (ref <0.10)

## 2022-11-05 LAB — ANTI-NUCLEAR AB-TITER (ANA TITER)
ANA TITER: 1:40 {titer} — ABNORMAL HIGH
ANA Titer 1: 1:40 {titer} — ABNORMAL HIGH

## 2022-11-05 LAB — PROLACTIN: Prolactin: 5.8 ng/mL

## 2022-11-05 LAB — INTERPRETATION:

## 2022-11-05 LAB — ANA: Anti Nuclear Antibody (ANA): POSITIVE — AB

## 2022-11-05 LAB — RHEUMATOID FACTOR: Rheumatoid fact SerPl-aCnc: <14 IU/mL (ref <14)

## 2022-11-05 MED ORDER — LOSARTAN POTASSIUM-HCTZ 100-25 MG PO TABS: 1.0000 | ORAL_TABLET | Freq: Every day | ORAL | 0 refills | Status: DC

## 2022-11-05 NOTE — Progress Notes (Signed)
Pancreatic enzyme lipase elevated.  Left upper quadrant abdominal pain. Suspected pancreatitis, which sometimes warrants going to hospital if pain becomes too much.   Let's order stat ultrasound abdomen.  I placed order at [address]. Pt can call to schedule.

## 2022-11-06 ENCOUNTER — Encounter: Payer: Self-pay | Admitting: Family

## 2022-11-06 DIAGNOSIS — L409 Psoriasis, unspecified: Secondary | ICD-10-CM

## 2022-11-06 DIAGNOSIS — Z8261 Family history of arthritis: Secondary | ICD-10-CM

## 2022-11-06 DIAGNOSIS — M2550 Pain in unspecified joint: Secondary | ICD-10-CM

## 2022-11-06 DIAGNOSIS — R768 Other specified abnormal immunological findings in serum: Secondary | ICD-10-CM

## 2022-11-07 ENCOUNTER — Telehealth: Payer: Self-pay | Admitting: Family

## 2022-11-07 ENCOUNTER — Ambulatory Visit
Admission: RE | Admit: 2022-11-07 | Discharge: 2022-11-07 | Disposition: A | Discharge status: Home / Self Care | Payer: 59 | Source: Ambulatory Visit | Attending: Family | Admitting: Family

## 2022-11-07 DIAGNOSIS — R1012 Left upper quadrant pain: Secondary | ICD-10-CM

## 2022-11-07 DIAGNOSIS — R748 Abnormal levels of other serum enzymes: Secondary | ICD-10-CM | POA: Diagnosis not present

## 2022-11-07 DIAGNOSIS — Z9049 Acquired absence of other specified parts of digestive tract: Secondary | ICD-10-CM | POA: Diagnosis not present

## 2022-11-07 NOTE — Progress Notes (Signed)
Can you see if pt still is still with pain?  Ultrasound of abdomen shows fatty liver but no acute findings.  Visualized area of pancreas with no concerns.

## 2022-11-07 NOTE — Telephone Encounter (Signed)
Patient called and stated she was returning a phone call.

## 2022-11-08 DIAGNOSIS — R768 Other specified abnormal immunological findings in serum: Secondary | ICD-10-CM | POA: Insufficient documentation

## 2022-11-09 NOTE — Telephone Encounter (Signed)
Return call to pt. Spoke to her about her results.

## 2022-11-13 ENCOUNTER — Ambulatory Visit (INDEPENDENT_AMBULATORY_CARE_PROVIDER_SITE_OTHER): Payer: 59 | Admitting: Family

## 2022-11-13 ENCOUNTER — Encounter: Payer: Self-pay | Admitting: Family

## 2022-11-13 VITALS — BP 140/98 | HR 91 | Temp 98.6°F | Resp 16 | Ht 65.0 in | Wt 177.5 lb

## 2022-11-13 DIAGNOSIS — I10 Essential (primary) hypertension: Secondary | ICD-10-CM | POA: Diagnosis not present

## 2022-11-13 DIAGNOSIS — R748 Abnormal levels of other serum enzymes: Secondary | ICD-10-CM

## 2022-11-13 DIAGNOSIS — E781 Pure hyperglyceridemia: Secondary | ICD-10-CM

## 2022-11-13 DIAGNOSIS — R1012 Left upper quadrant pain: Secondary | ICD-10-CM | POA: Diagnosis not present

## 2022-11-13 NOTE — Progress Notes (Signed)
Established Patient Office Visit  Subjective:  Patient ID: Christine Miranda, female    DOB: 02-Mar-1971  Age: 51 y.o. MRN: [id number]  CC:  Chief Complaint  Patient presents with   Abnormal Lab    discuss    HPI [name] is here today with concerns.   HTN: amlodipine 10 mg and losartan hydrochlorothiazide, hasn't checked blood pressure at home has been out and about, but no cp or sob. She does state the other day she had palpitations, but then only lasted for a few minutes and then went away has not occurred since did not have caffeine prior. Doesn't feel like she was anxious. Has noticed that no longer having headaches daily since increase to 10 mg amlodipine.    Spinal cord stimulator: with [facility], Dr. Rolena Infante, surgeon. They plan to replace the spinal cord battery. It is on her left lower buttock, internally.   Left upper pain, still ongoing. Has felt this for the last 5-6 years, has occasional flares 4-5 times a year. At 'end' of flare right now she feels, lipase was slightly elevated. She drinks alcohol 2-3 beers a weekend. Suspected chronic pancreatitis, pt does state grandma might have had chronic pancreatitis.   Elevated triglycerides, back 5 months ago was 269. Pt does find issues with high fat diet.   U/s abdomen: visualized portion unremarkable.    Past Medical History:  Diagnosis Date   Allergic rhinitis    Asthma    Eczema    Lower half migraine    Pelvic fracture ([place])     Past Surgical History:  Procedure Laterality Date   ABDOMINAL HYSTERECTOMY     partial, still with left ovary. 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   arm surgery     CHOLECYSTECTOMY     ELBOW SURGERY     HAND SURGERY     KNEE SURGERY     x 2   OVARY SURGERY     PELVIC SYMPHYSIS FUSION     SPINAL CORD STIMULATOR IMPLANT      Family History  Problem Relation Age of Onset   Hyperlipidemia Mother    Hypertension Mother    Rheum arthritis Mother    Diabetes Mother     Hyperlipidemia Father    Deep vein thrombosis Father        Provoked   Pulmonary embolism Father        Provoked   Diabetes Father    Asthma Maternal Grandmother    Diabetes Maternal Grandmother    Asthma Son    Eczema Son    Arthritis Maternal Aunt    Allergic rhinitis Neg Hx    Angioedema Neg Hx    Immunodeficiency Neg Hx    Urticaria Neg Hx     Social History   Socioeconomic History   Marital status: Divorced    Spouse name: Not on file   Number of children: 3   Years of education: Not on file   Highest education level: Not on file  Occupational History   Occupation: buisness owner  Tobacco Use   Smoking status: Former    Packs/day: 0.50    Years: 5.00    Total pack years: 2.50    Types: Cigarettes    Quit date: 12/31/1988    Years since quitting: 33.8   Smokeless tobacco: Never   Tobacco comments:    Stepfather for 5 years   Vaping Use   Vaping Use: Never used  Substance and Sexual Activity  Alcohol use: Yes    Alcohol/week: 1.0 - 2.0 standard drink of alcohol    Types: 1 - 2 Glasses of wine per week   Drug use: No   Sexual activity: Yes    Partners: Male    Birth control/protection: Surgical  Other Topics Concern   Not on file  Social History Narrative   Originally from [place], [place]. Has lived in [place], [place], [place], [place]. Moved to  in 1996. She has 2 [employer]. She does have some exposure to cleaning chemical fumes. Has a cat. No bird, mold, or hot tub exposure. Enjoys shopping.    Social Determinants of Health   Financial Resource Strain: Not on file  Food Insecurity: Not on file  Transportation Needs: Not on file  Physical Activity: Not on file  Stress: Not on file  Social Connections: Not on file  Intimate Partner Violence: Not on file    Outpatient Medications Prior to Visit  Medication Sig Dispense Refill   albuterol (VENTOLIN HFA) 108 (90 Base) MCG/ACT inhaler Inhale 1-2 puffs into the lungs as needed.     amLODipine  (NORVASC) 10 MG tablet Take 1 tablet (10 mg total) by mouth daily. 90 tablet 0   EPINEPHrine 0.3 mg/0.3 mL IJ SOAJ injection Inject into the muscle once.     losartan-hydrochlorothiazide (HYZAAR) 100-25 MG tablet Take 1 tablet by mouth daily. 90 tablet 0   methocarbamol (ROBAXIN) 500 MG tablet TAKE 1 TABLET BY MOUTH THREE TIMES A DAY 90 tablet 0   SKYRIZI PEN 150 MG/ML SOAJ Inject 150 mg into the skin every 3 (three) months.     omeprazole (PRILOSEC) 20 MG capsule Take 1 capsule (20 mg total) by mouth daily. (Patient not taking: Reported on 11/13/2022) 30 capsule 3   Vitamin D, Ergocalciferol, (DRISDOL) 1.25 MG (50000 UNIT) CAPS capsule Take 1 capsule (50,000 Units total) by mouth every 7 (seven) days. (Patient not taking: Reported on 11/13/2022) 4 capsule 1   No facility-administered medications prior to visit.    Allergies  Allergen Reactions   Hydrocodone Hives   Iodine Hives   Prednisone     Rapid heartrate   Tincture Of Benzoin [Benzoin]     Blister        Objective:    Physical Exam Constitutional:      Appearance: Normal appearance.  Cardiovascular:     Rate and Rhythm: Normal rate and regular rhythm.  Pulmonary:     Effort: Pulmonary effort is normal.  Neurological:     General: No focal deficit present.     Mental Status: She is alert and oriented to person, place, and time. Mental status is at baseline.  Psychiatric:        Mood and Affect: Mood normal.        Behavior: Behavior normal.        Thought Content: Thought content normal.        Judgment: Judgment normal.     BP (!) 140/98   Pulse 91   Temp 98.6 F (37 C)   Resp 16   Ht '5\' 5"'$  (1.651 m)   Wt 177 lb 8 oz (80.5 kg)   SpO2 99%   BMI 29.54 kg/m  Wt Readings from Last 3 Encounters:  11/13/22 177 lb 8 oz (80.5 kg)  11/02/22 178 lb 8 oz (81 kg)  09/26/22 174 lb 6 oz (79.1 kg)     Health Maintenance Due  Topic Date Due   COVID-19 Vaccine (1) Never  done   HIV Screening  Never done    Hepatitis C Screening  Never done   TETANUS/TDAP  Never done   PAP SMEAR-Modifier  Never done   COLONOSCOPY (Pts 45-78yr Insurance coverage will need to be confirmed)  Never done   MAMMOGRAM  Never done   Zoster Vaccines- Shingrix (1 of 2) Never done    There are no preventive care reminders to display for this patient.  Lab Results  Component Value Date   TSH 1.96 09/12/2022   Lab Results  Component Value Date   WBC 4.5 09/12/2022   HGB 14.4 09/12/2022   HCT 41.8 09/12/2022   MCV 89.2 09/12/2022   PLT 212.0 09/12/2022   Lab Results  Component Value Date   NA 139 09/12/2022   K 3.9 09/12/2022   CO2 24 09/12/2022   GLUCOSE 108 (H) 09/12/2022   BUN 12 09/12/2022   CREATININE 0.61 09/12/2022   BILITOT 0.7 09/12/2022   ALKPHOS 92 09/12/2022   AST 21 09/12/2022   ALT 26 09/12/2022   PROT 7.0 09/12/2022   ALBUMIN 4.1 09/12/2022   CALCIUM 9.3 09/12/2022   GFR 102.76 09/12/2022   Lab Results  Component Value Date   HGBA1C 5.3 05/31/2022      Assessment & Plan:   Problem List Items Addressed This Visit       Cardiovascular and Mediastinum   Primary hypertension    Continue amlodipine 10 mg and losartan hctz  Advised pt to keep track x one week of blood pressure log, goal <130/90 on regular basis         Other   Hypertriglyceridemia    Reviewed last lipid panel, Ordered lipid panel, pending results. Work on low cholesterol diet and exercise as tolerated       Elevated lipase    Will continue to monitor.       Relevant Orders   Ambulatory referral to Gastroenterology   Left upper quadrant abdominal pain - Primary    Suspected chronic pancreatitis? Current flare suspected to be ending, pain dissapating.  Lipase mildly elevated last visit.  Referral placed for gi for work up.       Relevant Orders   Ambulatory referral to Gastroenterology    No orders of the defined types were placed in this encounter.   Follow-up: No follow-ups on file.     TEugenia Pancoast FNP

## 2022-11-13 NOTE — Telephone Encounter (Signed)
-----   Message from Eugenia Pancoast, [place] sent at 11/13/2022  8:11 AM EST ----- Can you please call over to [facility] for pt, upcoming psinal cord battery replacement and request more appropriate pre op clearance request? What type of anesthesia? Is this with Dr. Rolena Infante?

## 2022-11-13 NOTE — Assessment & Plan Note (Signed)
Will continue to monitor.

## 2022-11-13 NOTE — Assessment & Plan Note (Signed)
Continue amlodipine 10 mg and losartan hctz  Advised pt to keep track x one week of blood pressure log, goal <130/90 on regular basis

## 2022-11-13 NOTE — Telephone Encounter (Signed)
Ok, pt will need pre op appt. We can change her 11/28 appt to the pre op appt , she requested that. But then can you call and let her know it will require a pre op clearance and she should expect a pre op that day?

## 2022-11-13 NOTE — Assessment & Plan Note (Signed)
Suspected chronic pancreatitis? Current flare suspected to be ending, pain dissapating.  Lipase mildly elevated last visit.  Referral placed for gi for work up.

## 2022-11-13 NOTE — Patient Instructions (Signed)
A referral was placed today for Gi.  Please let us know if you have not heard back within 2 weeks about the referral.   Regards,   Trevione Wert FNP-C

## 2022-11-13 NOTE — Assessment & Plan Note (Signed)
Reviewed last lipid panel, Ordered lipid panel, pending results. Work on low cholesterol diet and exercise as tolerated

## 2022-11-14 ENCOUNTER — Encounter: Payer: Self-pay | Admitting: Gastroenterology

## 2022-11-14 ENCOUNTER — Ambulatory Visit: Payer: 59 | Admitting: Gastroenterology

## 2022-11-14 ENCOUNTER — Other Ambulatory Visit: Payer: Self-pay

## 2022-11-14 VITALS — BP 163/91 | HR 97 | Temp 98.1°F | Ht 65.0 in | Wt 177.1 lb

## 2022-11-14 DIAGNOSIS — G8929 Other chronic pain: Secondary | ICD-10-CM | POA: Diagnosis not present

## 2022-11-14 DIAGNOSIS — R1012 Left upper quadrant pain: Secondary | ICD-10-CM

## 2022-11-14 DIAGNOSIS — K5904 Chronic idiopathic constipation: Secondary | ICD-10-CM

## 2022-11-14 DIAGNOSIS — L400 Psoriasis vulgaris: Secondary | ICD-10-CM | POA: Diagnosis not present

## 2022-11-14 DIAGNOSIS — Z1211 Encounter for screening for malignant neoplasm of colon: Secondary | ICD-10-CM

## 2022-11-14 MED ORDER — POLYETHYLENE GLYCOL 3350 17 GM/SCOOP PO POWD: 1.0000 | Freq: Every day | ORAL | 0 refills | Status: DC

## 2022-11-14 MED ORDER — NA SULFATE-K SULFATE-MG SULF 17.5-3.13-1.6 GM/177ML PO SOLN: 354.0000 mL | Freq: Once | ORAL | 0 refills | Status: AC

## 2022-11-14 NOTE — Progress Notes (Signed)
Christine Darby, MD [address]  [place]  [place], [place] [address]  Main: [phone]  Fax: [phone]    Gastroenterology Consultation  Referring Provider:     Eugenia Pancoast, [place] Primary Care Physician:  Eugenia Pancoast, FNP Primary Gastroenterologist:  Dr. Cephas Miranda Reason for Consultation: Left upper quadrant pain, chronic constipation        HPI:   Christine Miranda is a 51 y.o. female referred by Eugenia Pancoast, [place]  for consultation & management of left upper quadrant pain, chronic constipation.  Patient had history of cholecystectomy when she was in her 6s, since then she reports that she has been experiencing left upper quadrant discomfort, radiating down the left flank into left lower quadrant.  She always has difficulty moving her bowels, episodes of severe constipation followed by severe diarrhea, abdominal bloating as well.  She underwent work-up for left upper quadrant pain by her PCP earlier this month, found to have mildly elevated lipase 71, alpha gal panel showed allergy/intolerance to mutton, ANA positive.  She underwent complete abdominal ultrasound which revealed fatty liver.  Her LFTs have been normal.  Patient admits to not drinking adequate amount of water, does not have a well-balanced meal.  She started incorporating supplemental fiber psyllium husk daily.  She denies any rectal bleeding.  She denies any heartburn, epigastric discomfort or right upper for quadrant pain, nausea or vomiting  NSAIDs: None  Antiplts/Anticoagulants/Anti thrombotics: None  GI Procedures: None  Past Medical History:  Diagnosis Date   Allergic rhinitis    Asthma    Eczema    Lower half migraine    Pelvic fracture ([place])     Past Surgical History:  Procedure Laterality Date   ABDOMINAL HYSTERECTOMY     partial, still with left ovary. 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   arm surgery     CHOLECYSTECTOMY     ELBOW SURGERY     HAND SURGERY     KNEE SURGERY     x 2    OVARY SURGERY     PELVIC SYMPHYSIS FUSION     SPINAL CORD STIMULATOR IMPLANT       Current Outpatient Medications:    albuterol (VENTOLIN HFA) 108 (90 Base) MCG/ACT inhaler, Inhale 1-2 puffs into the lungs as needed., Disp: , Rfl:    amLODipine (NORVASC) 10 MG tablet, Take 1 tablet (10 mg total) by mouth daily., Disp: 90 tablet, Rfl: 0   losartan-hydrochlorothiazide (HYZAAR) 100-25 MG tablet, Take 1 tablet by mouth daily., Disp: 90 tablet, Rfl: 0   methocarbamol (ROBAXIN) 500 MG tablet, TAKE 1 TABLET BY MOUTH THREE TIMES A DAY, Disp: 90 tablet, Rfl: 0   Na Sulfate-K Sulfate-Mg Sulf 17.5-3.13-1.6 GM/177ML SOLN, Take 354 mLs by mouth once for 1 dose., Disp: 354 mL, Rfl: 0   polyethylene glycol powder (GLYCOLAX/MIRALAX) 17 GM/SCOOP powder, Take 255 g by mouth daily., Disp: 238 g, Rfl: 0   SKYRIZI PEN 150 MG/ML SOAJ, Inject 150 mg into the skin every 3 (three) months., Disp: , Rfl:    Family History  Problem Relation Age of Onset   Hyperlipidemia Mother    Hypertension Mother    Rheum arthritis Mother    Diabetes Mother    Hyperlipidemia Father    Deep vein thrombosis Father        Provoked   Pulmonary embolism Father        Provoked   Diabetes Father    Asthma Maternal Grandmother    Diabetes  Maternal Grandmother    Asthma Son    Eczema Son    Arthritis Maternal Aunt    Allergic rhinitis Neg Hx    Angioedema Neg Hx    Immunodeficiency Neg Hx    Urticaria Neg Hx      Social History   Tobacco Use   Smoking status: Former    Packs/day: 0.50    Years: 5.00    Total pack years: 2.50    Types: Cigarettes    Quit date: 12/31/1988    Years since quitting: 33.8   Smokeless tobacco: Never   Tobacco comments:    Stepfather for 5 years   Vaping Use   Vaping Use: Never used  Substance Use Topics   Alcohol use: Yes    Alcohol/week: 1.0 - 2.0 standard drink of alcohol    Types: 1 - 2 Glasses of wine per week   Drug use: No    Allergies as of 11/14/2022 - Review Complete  11/14/2022  Allergen Reaction Noted   Hydrocodone Hives 07/14/2013   Iodine Hives 07/14/2013   Prednisone  07/14/2013   Tincture of benzoin [benzoin]  07/30/2016    Review of Systems:    All systems reviewed and negative except where noted in HPI.   Physical Exam:  BP (!) 163/91 (BP Location: Left Arm, Patient Position: Sitting, Cuff Size: Normal)   Pulse 97   Temp 98.1 F (36.7 C) (Oral)   Ht '5\' 5"'$  (1.651 m)   Wt 177 lb 2 oz (80.3 kg)   BMI 29.48 kg/m  No LMP recorded. Patient has had a hysterectomy.  General:   Alert,  Well-developed, well-nourished, pleasant and cooperative in NAD Head:  Normocephalic and atraumatic. Eyes:  Sclera clear, no icterus.   Conjunctiva pink. Ears:  Normal auditory acuity. Nose:  No deformity, discharge, or lesions. Mouth:  No deformity or lesions,oropharynx pink & moist. Neck:  Supple; no masses or thyromegaly. Lungs:  Respirations even and unlabored.  Clear throughout to auscultation.   No wheezes, crackles, or rhonchi. No acute distress. Heart:  Regular rate and rhythm; no murmurs, clicks, rubs, or gallops. Abdomen:  Normal bowel sounds. Soft, non-tender and moderately distended, tympanic to percussion without masses, hepatosplenomegaly or hernias noted.  No guarding or rebound tenderness.   Rectal: Not performed Msk:  Symmetrical without gross deformities. Good, equal movement & strength bilaterally. Pulses:  Normal pulses noted. Extremities:  No clubbing or edema.  No cyanosis. Neurologic:  Alert and oriented x3;  grossly normal neurologically. Skin:  Intact without significant lesions or rashes. No jaundice. Psych:  Alert and cooperative. Normal mood and affect.  Imaging Studies: Reviewed  Assessment and Plan:   [name] is a 51 y.o. female with overweight, psoriasis on Skyrizi, hypertension, stage postcholecystectomy is seen in consultation for chronic constipation, chronic left upper quadrant pain  Left upper quadrant  pain Recommend upper endoscopy for further evaluation  Chronic constipation Recommend to start MiraLAX 34 g daily Discussed about high-fiber diet and adequate amount of water at least 2 L daily Continue fiber supplements  Elevated lipase Serum lipase is mildly elevated which is nonspecific If upper endoscopy and colonoscopy are unremarkable, recommend CT abdomen and pelvis with contrast  Colon cancer screening Patient never had a colonoscopy in her life Recommend screening colonoscopy with 2-day prep Patient is agreeable  I have discussed alternative options, risks & benefits,  which include, but are not limited to, bleeding, infection, perforation,respiratory complication & drug reaction.  The patient agrees  with this plan & written consent will be obtained.     Follow up in 6 months   Christine Darby, MD

## 2022-11-14 NOTE — Patient Instructions (Signed)

## 2022-11-14 NOTE — Telephone Encounter (Signed)
Called and informed the pt and she is ok with doing it on the 11/28 appointment.

## 2022-11-16 ENCOUNTER — Other Ambulatory Visit: Payer: Self-pay | Admitting: Family

## 2022-11-16 DIAGNOSIS — G8929 Other chronic pain: Secondary | ICD-10-CM

## 2022-11-27 ENCOUNTER — Ambulatory Visit: Payer: 59 | Admitting: Family

## 2022-12-14 ENCOUNTER — Encounter: Payer: Self-pay | Admitting: Gastroenterology

## 2022-12-17 ENCOUNTER — Ambulatory Visit: Payer: 59 | Admitting: Registered Nurse

## 2022-12-17 ENCOUNTER — Encounter
Admission: RE | Disposition: A | Discharge status: Home / Self Care | Payer: Self-pay | Source: Ambulatory Visit | Attending: Gastroenterology

## 2022-12-17 ENCOUNTER — Encounter: Payer: Self-pay | Admitting: Gastroenterology

## 2022-12-17 ENCOUNTER — Ambulatory Visit
Admission: RE | Admit: 2022-12-17 | Discharge: 2022-12-17 | Disposition: A | Discharge status: Home / Self Care | Payer: 59 | Source: Ambulatory Visit | Attending: Gastroenterology | Admitting: Gastroenterology

## 2022-12-17 DIAGNOSIS — K635 Polyp of colon: Secondary | ICD-10-CM | POA: Diagnosis not present

## 2022-12-17 DIAGNOSIS — J45909 Unspecified asthma, uncomplicated: Secondary | ICD-10-CM | POA: Insufficient documentation

## 2022-12-17 DIAGNOSIS — F32A Depression, unspecified: Secondary | ICD-10-CM | POA: Insufficient documentation

## 2022-12-17 DIAGNOSIS — D123 Benign neoplasm of transverse colon: Secondary | ICD-10-CM | POA: Insufficient documentation

## 2022-12-17 DIAGNOSIS — Z1211 Encounter for screening for malignant neoplasm of colon: Secondary | ICD-10-CM | POA: Diagnosis not present

## 2022-12-17 DIAGNOSIS — K3189 Other diseases of stomach and duodenum: Secondary | ICD-10-CM | POA: Insufficient documentation

## 2022-12-17 DIAGNOSIS — K317 Polyp of stomach and duodenum: Secondary | ICD-10-CM | POA: Diagnosis not present

## 2022-12-17 DIAGNOSIS — K319 Disease of stomach and duodenum, unspecified: Secondary | ICD-10-CM

## 2022-12-17 DIAGNOSIS — R1012 Left upper quadrant pain: Secondary | ICD-10-CM | POA: Diagnosis not present

## 2022-12-17 DIAGNOSIS — Z87891 Personal history of nicotine dependence: Secondary | ICD-10-CM | POA: Diagnosis not present

## 2022-12-17 DIAGNOSIS — Z79899 Other long term (current) drug therapy: Secondary | ICD-10-CM | POA: Insufficient documentation

## 2022-12-17 DIAGNOSIS — R69 Illness, unspecified: Secondary | ICD-10-CM | POA: Diagnosis not present

## 2022-12-17 HISTORY — PX: ESOPHAGOGASTRODUODENOSCOPY (EGD) WITH PROPOFOL: SHX5813

## 2022-12-17 HISTORY — PX: COLONOSCOPY WITH PROPOFOL: SHX5780

## 2022-12-17 SURGERY — COLONOSCOPY WITH PROPOFOL
Anesthesia: General

## 2022-12-17 MED ORDER — LIDOCAINE HCL (PF) 2 % IJ SOLN
INTRAMUSCULAR | Status: AC
  Filled 2022-12-17: qty 5

## 2022-12-17 MED ORDER — PROPOFOL 10 MG/ML IV BOLUS
INTRAVENOUS | Status: DC | PRN
  Administered 2022-12-17 (×3): 20 mg via INTRAVENOUS
  Administered 2022-12-17: 40 mg via INTRAVENOUS
  Administered 2022-12-17: 100 mg via INTRAVENOUS

## 2022-12-17 MED ORDER — LIDOCAINE HCL (CARDIAC) PF 100 MG/5ML IV SOSY
PREFILLED_SYRINGE | INTRAVENOUS | Status: DC | PRN
  Administered 2022-12-17: 40 mg via INTRAVENOUS

## 2022-12-17 MED ORDER — PROPOFOL 1000 MG/100ML IV EMUL
INTRAVENOUS | Status: AC
  Filled 2022-12-17: qty 100

## 2022-12-17 MED ORDER — DEXMEDETOMIDINE HCL IN NACL 200 MCG/50ML IV SOLN
INTRAVENOUS | Status: DC | PRN
  Administered 2022-12-17 (×2): 8 ug via INTRAVENOUS

## 2022-12-17 MED ORDER — PROPOFOL 500 MG/50ML IV EMUL
INTRAVENOUS | Status: DC | PRN
  Administered 2022-12-17: 175 ug/kg/min via INTRAVENOUS

## 2022-12-17 MED ORDER — SODIUM CHLORIDE 0.9 % IV SOLN
INTRAVENOUS | Status: DC
  Administered 2022-12-17: 1000 mL via INTRAVENOUS

## 2022-12-17 MED ORDER — GLYCOPYRROLATE 0.2 MG/ML IJ SOLN
INTRAMUSCULAR | Status: DC | PRN
  Administered 2022-12-17: .2 mg via INTRAVENOUS

## 2022-12-17 MED ORDER — GLYCOPYRROLATE 0.2 MG/ML IJ SOLN
INTRAMUSCULAR | Status: AC
  Filled 2022-12-17: qty 1

## 2022-12-17 NOTE — Anesthesia Procedure Notes (Signed)
Date/Time: 12/17/2022 9:16 AM  Performed by: Doreen Salvage, CRNAPre-anesthesia Checklist: Patient identified, Emergency Drugs available, Suction available and Patient being monitored Patient Re-evaluated:Patient Re-evaluated prior to induction Oxygen Delivery Method: Nasal cannula Induction Type: IV induction Dental Injury: Teeth and Oropharynx as per pre-operative assessment  Comments: Nasal cannula with etCO2 monitoring

## 2022-12-17 NOTE — Op Note (Signed)
[facility] Patient Name: Christine Miranda Procedure Date: 12/17/2022 9:16 AM MRN: [id number] Account #: [id number] Date of Birth: 05-08-1971 Admit Type: Outpatient Age: 51 Room: [facility] ENDO ROOM 4 Gender: Female Note Status: Finalized Instrument Name: Upper Endoscope [id number] Procedure:             Upper GI endoscopy Indications:           Abdominal pain in the left upper quadrant Providers:             Lin Landsman MD, MD Referring MD:          Eugenia Pancoast (Referring MD) Medicines:             General Anesthesia Complications:         No immediate complications. Estimated blood loss: None. Procedure:             Pre-Anesthesia Assessment:                        - Prior to the procedure, a History and Physical was                         performed, and patient medications and allergies were                         reviewed. The patient is competent. The risks and                         benefits of the procedure and the sedation options and                         risks were discussed with the patient. All questions                         were answered and informed consent was obtained.                         Patient identification and proposed procedure were                         verified by the physician, the nurse, the                         anesthesiologist, the anesthetist and the technician                         in the pre-procedure area in the procedure room in the                         endoscopy suite. Mental Status Examination: alert and                         oriented. Airway Examination: normal oropharyngeal                         airway and neck mobility. Respiratory Examination:                         clear to auscultation. CV Examination: normal.  Prophylactic Antibiotics: The patient does not require                         prophylactic antibiotics. Prior Anticoagulants: The                          patient has taken no anticoagulant or antiplatelet                         agents. ASA Grade Assessment: II - A patient with mild                         systemic disease. After reviewing the risks and                         benefits, the patient was deemed in satisfactory                         condition to undergo the procedure. The anesthesia                         plan was to use general anesthesia. Immediately prior                         to administration of medications, the patient was                         re-assessed for adequacy to receive sedatives. The                         heart rate, respiratory rate, oxygen saturations,                         blood pressure, adequacy of pulmonary ventilation, and                         response to care were monitored throughout the                         procedure. The physical status of the patient was                         re-assessed after the procedure.                        After obtaining informed consent, the endoscope was                         passed under direct vision. Throughout the procedure,                         the patient's blood pressure, pulse, and oxygen                         saturations were monitored continuously. The Endoscope                         was introduced through the mouth, and advanced to the  second part of duodenum. The upper GI endoscopy was                         accomplished without difficulty. The patient tolerated                         the procedure well. Findings:      The duodenal bulb and second portion of the duodenum were normal.      Diffuse mildly erythematous mucosa without bleeding was found in the       gastric body and in the gastric antrum. Biopsies were taken with a cold       forceps for Helicobacter pylori testing. Estimated blood loss: none.      A single 7 mm sessile polyp with no bleeding and no stigmata of recent       bleeding was  found in the gastric body. Biopsies were taken with a cold       forceps for histology.      The cardia and gastric fundus were normal on retroflexion.      Esophagogastric landmarks were identified: the gastroesophageal junction       was found at 40 cm from the incisors.      The gastroesophageal junction and examined esophagus were normal. Impression:            - Normal duodenal bulb and second portion of the                         duodenum.                        - Erythematous mucosa in the gastric body and antrum.                         Biopsied.                        - A single gastric polyp. Biopsied.                        - Esophagogastric landmarks identified.                        - Normal gastroesophageal junction and esophagus. Recommendation:        - Await pathology results.                        - Discharge patient to home (with escort).                        - Resume previous diet today.                        - Continue present medications.                        - Proceed with colonoscopy as scheduled                        See colonoscopy report Procedure Code(s):     --- Professional ---  54492, Esophagogastroduodenoscopy, flexible,                         transoral; with biopsy, single or multiple Diagnosis Code(s):     --- Professional ---                        K31.89, Other diseases of stomach and duodenum                        R10.12, Left upper quadrant pain                        K31.7, Polyp of stomach and duodenum CPT copyright 2022 American Medical Association. All rights reserved. The codes documented in this report are preliminary and upon coder review may  be revised to meet current compliance requirements. Dr. Ulyess Mort Lin Landsman MD, MD 12/17/2022 9:34:01 AM This report has been signed electronically. Number of Addenda: 0 Note Initiated On: 12/17/2022 9:16 AM Estimated Blood Loss:  Estimated blood loss:  none.      [facility]

## 2022-12-17 NOTE — Anesthesia Postprocedure Evaluation (Signed)
Anesthesia Post Note  Patient: Letica Giaimo  Procedure(s) Performed: COLONOSCOPY WITH PROPOFOL ESOPHAGOGASTRODUODENOSCOPY (EGD) WITH PROPOFOL  Patient location during evaluation: Endoscopy Anesthesia Type: General Level of consciousness: awake and alert Pain management: pain level controlled Vital Signs Assessment: post-procedure vital signs reviewed and stable Respiratory status: spontaneous breathing, nonlabored ventilation, respiratory function stable and patient connected to nasal cannula oxygen Cardiovascular status: blood pressure returned to baseline and stable Postop Assessment: no apparent nausea or vomiting Anesthetic complications: no   No notable events documented.   Last Vitals:  Vitals:   12/17/22 1005 12/17/22 1015  BP: 111/78 116/84  Pulse: 70 67  Resp: (!) 21 16  Temp:    SpO2: 98% 99%    Last Pain:  Vitals:   12/17/22 1015  TempSrc:   PainSc: 0-No pain                 Martha Clan

## 2022-12-17 NOTE — Op Note (Signed)
[facility] Gastroenterology Patient Name: Christine Miranda Procedure Date: 12/17/2022 9:15 AM MRN: [id number] Account #: [id number] Date of Birth: 1971-07-20 Admit Type: Outpatient Age: 51 Room: [facility] ENDO ROOM 4 Gender: Female Note Status: Finalized Instrument Name: Jasper Riling [id number] Procedure:             Colonoscopy Indications:           Screening for colorectal malignant neoplasm, This is                         the patient's first colonoscopy Providers:             Lin Landsman MD, MD Referring MD:          Eugenia Pancoast (Referring MD) Medicines:             General Anesthesia Complications:         No immediate complications. Estimated blood loss: None. Procedure:             Pre-Anesthesia Assessment:                        - Prior to the procedure, a History and Physical was                         performed, and patient medications and allergies were                         reviewed. The patient is competent. The risks and                         benefits of the procedure and the sedation options and                         risks were discussed with the patient. All questions                         were answered and informed consent was obtained.                         Patient identification and proposed procedure were                         verified by the physician, the nurse, the                         anesthesiologist, the anesthetist and the technician                         in the pre-procedure area in the procedure room in the                         endoscopy suite. Mental Status Examination: alert and                         oriented. Airway Examination: normal oropharyngeal                         airway and neck mobility. Respiratory Examination:  clear to auscultation. CV Examination: normal.                         Prophylactic Antibiotics: The patient does not require                         prophylactic  antibiotics. Prior Anticoagulants: The                         patient has taken no anticoagulant or antiplatelet                         agents. ASA Grade Assessment: II - A patient with mild                         systemic disease. After reviewing the risks and                         benefits, the patient was deemed in satisfactory                         condition to undergo the procedure. The anesthesia                         plan was to use general anesthesia. Immediately prior                         to administration of medications, the patient was                         re-assessed for adequacy to receive sedatives. The                         heart rate, respiratory rate, oxygen saturations,                         blood pressure, adequacy of pulmonary ventilation, and                         response to care were monitored throughout the                         procedure. The physical status of the patient was                         re-assessed after the procedure.                        After obtaining informed consent, the colonoscope was                         passed under direct vision. Throughout the procedure,                         the patient's blood pressure, pulse, and oxygen                         saturations were monitored continuously. The  Colonoscope was introduced through the anus and                         advanced to the the terminal ileum, with                         identification of the appendiceal orifice and IC                         valve. The colonoscopy was performed without                         difficulty. The patient tolerated the procedure well.                         The quality of the bowel preparation was evaluated                         using the BBPS [facility] Bowel Preparation Scale) with                         scores of: Right Colon = 3, Transverse Colon = 3 and                         Left Colon = 3 (entire  mucosa seen well with no                         residual staining, small fragments of stool or opaque                         liquid). The total BBPS score equals 9. The terminal                         ileum, ileocecal valve, appendiceal orifice, and                         rectum were photographed. Findings:      The perianal and digital rectal examinations were normal. Pertinent       negatives include normal sphincter tone and no palpable rectal lesions.      The terminal ileum appeared normal.      A 5 mm polyp was found in the transverse colon. The polyp was sessile.       The polyp was removed with a cold snare. Resection and retrieval were       complete. Estimated blood loss: none.      The retroflexed view of the distal rectum and anal verge was normal and       showed no anal or rectal abnormalities.      The exam was otherwise without abnormality. Impression:            - The examined portion of the ileum was normal.                        - One 5 mm polyp in the transverse colon, removed with                         a cold snare. Resected and  retrieved.                        - The distal rectum and anal verge are normal on                         retroflexion view.                        - The examination was otherwise normal. Recommendation:        - Discharge patient to home (with escort).                        - Resume previous diet today.                        - Continue present medications.                        - Await pathology results.                        - Repeat colonoscopy in 5 years for surveillance based                         on pathology results. Procedure Code(s):     --- Professional ---                        [phone], Colonoscopy, flexible; with removal of                         tumor(s), polyp(s), or other lesion(s) by snare                         technique Diagnosis Code(s):     --- Professional ---                        Z12.11, Encounter for  screening for malignant neoplasm                         of colon                        D12.3, Benign neoplasm of transverse colon (hepatic                         flexure or splenic flexure) CPT copyright 2022 American Medical Association. All rights reserved. The codes documented in this report are preliminary and upon coder review may  be revised to meet current compliance requirements. Dr. Ulyess Mort Lin Landsman MD, MD 12/17/2022 9:52:31 AM This report has been signed electronically. Number of Addenda: 0 Note Initiated On: 12/17/2022 9:15 AM Scope Withdrawal Time: 0 hours 12 minutes 2 seconds  Total Procedure Duration: 0 hours 14 minutes 45 seconds  Estimated Blood Loss:  Estimated blood loss: none.      [facility]

## 2022-12-17 NOTE — Anesthesia Preprocedure Evaluation (Signed)
Anesthesia Evaluation  Patient identified by MRN, date of birth, ID band Patient awake    Reviewed: Allergy & Precautions, H&P , NPO status , Patient's Chart, lab work & pertinent test results, reviewed documented beta blocker date and time   History of Anesthesia Complications Negative for: history of anesthetic complications  Airway Mallampati: I  TM Distance: >3 FB Neck ROM: full    Dental  (+) Teeth Intact, Dental Advidsory Given   Pulmonary neg shortness of breath, asthma , neg sleep apnea, neg COPD, neg recent URI, former smoker   Pulmonary exam normal breath sounds clear to auscultation       Cardiovascular Exercise Tolerance: Good negative cardio ROS Normal cardiovascular exam Rhythm:regular Rate:Normal     Neuro/Psych  PSYCHIATRIC DISORDERS  Depression    negative neurological ROS     GI/Hepatic negative GI ROS, Neg liver ROS,,,  Endo/Other  negative endocrine ROS    Renal/GU negative Renal ROS  negative genitourinary   Musculoskeletal   Abdominal   Peds  Hematology negative hematology ROS (+)   Anesthesia Other Findings Past Medical History: No date: Allergic rhinitis No date: Asthma No date: Eczema No date: Lower half migraine No date: Pelvic fracture (HCC)   Reproductive/Obstetrics negative OB ROS                             Anesthesia Physical Anesthesia Plan  ASA: 2  Anesthesia Plan: General   Post-op Pain Management:    Induction: Intravenous  PONV Risk Score and Plan: 3 and Propofol infusion and TIVA  Airway Management Planned: Natural Airway and Nasal Cannula  Additional Equipment:   Intra-op Plan:   Post-operative Plan:   Informed Consent: I have reviewed the patients History and Physical, chart, labs and discussed the procedure including the risks, benefits and alternatives for the proposed anesthesia with the patient or authorized representative who  has indicated his/her understanding and acceptance.     Dental Advisory Given  Plan Discussed with: Anesthesiologist, CRNA and Surgeon  Anesthesia Plan Comments:        Anesthesia Quick Evaluation

## 2022-12-17 NOTE — Transfer of Care (Signed)
Immediate Anesthesia Transfer of Care Note  Patient: Christine Miranda  Procedure(s) Performed: Procedure(s): COLONOSCOPY WITH PROPOFOL (N/A) ESOPHAGOGASTRODUODENOSCOPY (EGD) WITH PROPOFOL (N/A)  Patient Location: PACU and Endoscopy Unit  Anesthesia Type:General  Level of Consciousness: sedated  Airway & Oxygen Therapy: Patient Spontanous Breathing and Patient connected to nasal cannula oxygen  Post-op Assessment: Report given to RN and Post -op Vital signs reviewed and stable  Post vital signs: Reviewed and stable  Last Vitals:  Vitals:   12/17/22 0908 12/17/22 0955  BP: (!) 154/94 104/71  Pulse: 90 86  Resp:  15  Temp: (!) 35.9 C   SpO2: 19% 597%    Complications: No apparent anesthesia complications

## 2022-12-17 NOTE — H&P (Signed)
[name], MD [address]  [place]  [place], [place] [address]  Main: [phone]  Fax: [phone] Pager: [phone]  Primary Care Physician:  Eugenia Pancoast, FNP Primary Gastroenterologist:  Dr. [name]  Pre-Procedure History & Physical: HPI:  Christine Miranda is a 51 y.o. female is here for an endoscopy and colonoscopy.   Past Medical History:  Diagnosis Date   Allergic rhinitis    Asthma    Eczema    Lower half migraine    Pelvic fracture ([place])     Past Surgical History:  Procedure Laterality Date   ABDOMINAL HYSTERECTOMY     partial, still with left ovary. 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 Authorizing Provider  albuterol (VENTOLIN HFA) 108 (90 Base) MCG/ACT inhaler Inhale 1-2 puffs into the lungs as needed. 12/03/21   [provider]  amLODipine (NORVASC) 10 MG tablet Take 1 tablet (10 mg total) by mouth daily. 11/02/22   Eugenia Pancoast, FNP  losartan-hydrochlorothiazide (HYZAAR) 100-25 MG tablet Take 1 tablet by mouth daily. 11/05/22   Eugenia Pancoast, FNP  methocarbamol (ROBAXIN) 500 MG tablet TAKE 1 TABLET BY MOUTH THREE TIMES A DAY 10/09/22   Dutch Quint B, FNP  polyethylene glycol powder (GLYCOLAX/MIRALAX) 17 GM/SCOOP powder Take 255 g by mouth daily. 11/14/22   Lin Landsman, MD  SKYRIZI PEN 150 MG/ML SOAJ Inject 150 mg into the skin every 3 (three) months. 05/24/22   [provider]    Allergies as of 11/14/2022 - Review Complete 11/14/2022  Allergen Reaction Noted   Hydrocodone Hives 07/14/2013   Iodine Hives 07/14/2013   Prednisone  07/14/2013   Tincture of benzoin [benzoin]  07/30/2016    Family History  Problem Relation Age of Onset   Hyperlipidemia  Mother    Hypertension Mother    Rheum arthritis Mother    Diabetes Mother    Hyperlipidemia Father    Deep vein thrombosis Father        Provoked   Pulmonary embolism Father        Provoked   Diabetes Father    Asthma Maternal Grandmother    Diabetes Maternal Grandmother    Asthma Son    Eczema Son    Arthritis Maternal Aunt    Allergic rhinitis Neg Hx    Angioedema Neg Hx    Immunodeficiency Neg Hx    Urticaria Neg Hx     Social History   Socioeconomic History   Marital status: Divorced    Spouse name: Not on file   Number of children: 3   Years of education: Not on file   Highest education level: Not on file  Occupational History   Occupation: buisness owner  Tobacco Use   Smoking status: Former    Packs/day: 0.50    Years: 5.00    Total pack years: 2.50    Types: Cigarettes    Quit date: 12/31/1988    Years since quitting: 33.9   Smokeless tobacco: Never   Tobacco comments:    Stepfather for 5 years   Vaping Use   Vaping Use: Never  used  Substance and Sexual Activity   Alcohol use: Yes    Alcohol/week: 1.0 - 2.0 standard drink of alcohol    Types: 1 - 2 Glasses of wine per week   Drug use: No   Sexual activity: Yes    Partners: Male    Birth control/protection: Surgical  Other Topics Concern   Not on file  Social History Narrative   Originally from [place], [place]. Has lived in [place], [place], [place], [place]. Moved to  in 1996. She has 2 [employer]. She does have some exposure to cleaning chemical fumes. Has a cat. No bird, mold, or hot tub exposure. Enjoys shopping.    Social Determinants of Health   Financial Resource Strain: Not on file  Food Insecurity: Not on file  Transportation Needs: Not on file  Physical Activity: Not on file  Stress: Not on file  Social Connections: Not on file  Intimate Partner Violence: Not on file    Review of Systems: See HPI, otherwise negative ROS  Physical Exam: BP (!) 154/94   Pulse 90   Temp  (!) 96.6 F (35.9 C) (Temporal)   Ht '5\' 5"'$  (1.651 m)   Wt 78.2 kg   SpO2 99%   BMI 28.68 kg/m  General:   Alert,  pleasant and cooperative in NAD Head:  Normocephalic and atraumatic. Neck:  Supple; no masses or thyromegaly. Lungs:  Clear throughout to auscultation.    Heart:  Regular rate and rhythm. Abdomen:  Soft, nontender and nondistended. Normal bowel sounds, without guarding, and without rebound.   Neurologic:  Alert and  oriented x4;  grossly normal neurologically.  Impression/Plan: Christine Miranda is here for an endoscopy and colonoscopy to be performed for LUQ pain, colon cancer screening  Risks, benefits, limitations, and alternatives regarding  endoscopy and colonoscopy have been reviewed with the patient.  Questions have been answered.  All parties agreeable.   Sherri Sear, MD  12/17/2022, 9:16 AM

## 2022-12-18 ENCOUNTER — Encounter: Payer: Self-pay | Admitting: Gastroenterology

## 2022-12-18 ENCOUNTER — Ambulatory Visit: Payer: 59 | Admitting: Family

## 2022-12-18 LAB — SURGICAL PATHOLOGY

## 2022-12-19 ENCOUNTER — Telehealth: Payer: Self-pay | Admitting: Family

## 2022-12-19 NOTE — Telephone Encounter (Signed)
Look back in pt chart and don't see any records of anyone calling pt.

## 2022-12-19 NOTE — Telephone Encounter (Signed)
Received surgical clearance form for patient. Verified appointment for 01/03/23 will be her pre-op exam.

## 2022-12-19 NOTE — Telephone Encounter (Signed)
Patient called and stated he was returning a phone call from [place]

## 2023-01-03 ENCOUNTER — Encounter: Payer: Self-pay | Admitting: Family

## 2023-01-03 ENCOUNTER — Ambulatory Visit (INDEPENDENT_AMBULATORY_CARE_PROVIDER_SITE_OTHER)
Admission: RE | Admit: 2023-01-03 | Discharge: 2023-01-03 | Disposition: A | Discharge status: Home / Self Care | Payer: 59 | Source: Ambulatory Visit | Attending: Family | Admitting: Family

## 2023-01-03 ENCOUNTER — Ambulatory Visit (INDEPENDENT_AMBULATORY_CARE_PROVIDER_SITE_OTHER): Payer: 59 | Admitting: Family

## 2023-01-03 VITALS — BP 132/82 | HR 92 | Ht 65.0 in | Wt 179.0 lb

## 2023-01-03 DIAGNOSIS — G8929 Other chronic pain: Secondary | ICD-10-CM

## 2023-01-03 DIAGNOSIS — Z860100 Personal history of colon polyps, unspecified: Secondary | ICD-10-CM | POA: Insufficient documentation

## 2023-01-03 DIAGNOSIS — M5441 Lumbago with sciatica, right side: Secondary | ICD-10-CM | POA: Diagnosis not present

## 2023-01-03 DIAGNOSIS — Z01818 Encounter for other preprocedural examination: Secondary | ICD-10-CM

## 2023-01-03 DIAGNOSIS — M5442 Lumbago with sciatica, left side: Secondary | ICD-10-CM

## 2023-01-03 LAB — CBC
HCT: 42.2 % (ref 36.0–46.0)
Hemoglobin: 14.6 g/dL (ref 12.0–15.0)
MCHC: 34.5 g/dL (ref 30.0–36.0)
MCV: 89.5 fl (ref 78.0–100.0)
Platelets: 259 10*3/uL (ref 150.0–400.0)
RBC: 4.72 Mil/uL (ref 3.87–5.11)
RDW: 13 % (ref 11.5–15.5)
WBC: 5.1 10*3/uL (ref 4.0–10.5)

## 2023-01-03 LAB — COMPREHENSIVE METABOLIC PANEL
ALT: 34 U/L (ref 0–35)
AST: 27 U/L (ref 0–37)
Albumin: 4.6 g/dL (ref 3.5–5.2)
Alkaline Phosphatase: 65 U/L (ref 39–117)
BUN: 13 mg/dL (ref 6–23)
CO2: 29 mEq/L (ref 19–32)
Calcium: 9.9 mg/dL (ref 8.4–10.5)
Chloride: 101 mEq/L (ref 96–112)
Creatinine, Ser: 0.56 mg/dL (ref 0.40–1.20)
GFR: 104.67 mL/min (ref >60.00)
Glucose, Bld: 108 mg/dL — ABNORMAL HIGH (ref 70–99)
Potassium: 3.4 mEq/L — ABNORMAL LOW (ref 3.5–5.1)
Sodium: 139 mEq/L (ref 135–145)
Total Bilirubin: 0.8 mg/dL (ref 0.2–1.2)
Total Protein: 7.7 g/dL (ref 6.0–8.3)

## 2023-01-03 LAB — URINALYSIS, ROUTINE W REFLEX MICROSCOPIC
Bilirubin Urine: NEGATIVE
Hgb urine dipstick: NEGATIVE
Ketones, ur: NEGATIVE
Leukocytes,Ua: NEGATIVE
Nitrite: NEGATIVE
RBC / HPF: NONE SEEN (ref 0–?)
Specific Gravity, Urine: <=1.005 — AB (ref 1.000–1.030)
Total Protein, Urine: NEGATIVE
Urine Glucose: NEGATIVE
Urobilinogen, UA: 0.2 (ref 0.0–1.0)
WBC, UA: NONE SEEN (ref 0–?)
pH: 7 (ref 5.0–8.0)

## 2023-01-03 LAB — PROTIME-INR
INR: 0.9 ratio (ref 0.8–1.0)
Prothrombin Time: 10.2 s (ref 9.6–13.1)

## 2023-01-03 MED ORDER — METHOCARBAMOL 500 MG PO TABS: ORAL_TABLET | ORAL | 0 refills | Status: DC

## 2023-01-03 NOTE — Assessment & Plan Note (Addendum)
No chance of pregnancy as pt abdominal hysterectomy age 52.  EKG cxr ordered today Cbc cmp pt inr and urine culture ordered today and pending Once results received, pending clearance approval  Addendum 01/08/23 Reviewing CBC CMP PT INR Urinalysis, CXR and EKG pt is cleared for surgery from a primary care perspective.

## 2023-01-03 NOTE — Progress Notes (Signed)
Lab work acceptable for preop.  Pending results of urine culture

## 2023-01-03 NOTE — Patient Instructions (Addendum)
  Complete xray(s) prior to leaving today. I will notify you of your results once received.

## 2023-01-03 NOTE — Progress Notes (Addendum)
Subjective:    Christine Miranda is a 52 y.o. female who presents to the office today for a preoperative consultation at the request of surgeon Dr. Rolena Infante who plans on performing spinal cord battery replacement on a to be determined date, suspected to be January 19th 2024. This consultation is requested for the specific conditions prompting preoperative evaluation (i.e. because of potential affect on operative risk): Marland Kitchen Planned anesthesia: general. The patient has the following known anesthesia issues:  h/o nausea with anesthesia, and hard time waking up per pt . Patients bleeding risk: no recent abnormal bleeding. Patient does not have objections to receiving blood products if needed.  The following portions of the patient's history were reviewed and updated as appropriate: allergies, current medications, past family history, past medical history, past social history, past surgical history, and problem list.  Review of Systems Pertinent items noted in HPI and remainder of comprehensive ROS otherwise negative.    Objective:    BP 132/82   Pulse 92   Ht '5\' 5"'$  (1.651 m)   Wt 179 lb (81.2 kg)   SpO2 98%   BMI 29.79 kg/m   General Appearance:    Alert, cooperative, no distress, appears stated age  Head:    Normocephalic, without obvious abnormality, atraumatic  Eyes:    PERRL, conjunctiva/corneas clear, EOM's intact, fundi    benign, both eyes  Ears:    Normal TM's and external ear canals, both ears  Nose:   Nares normal, septum midline, mucosa normal, no drainage    or sinus tenderness  Throat:   Lips, mucosa, and tongue normal; teeth and gums normal  Neck:   Supple, symmetrical, trachea midline, no adenopathy;    thyroid:  no enlargement/tenderness/nodules; no carotid   bruit or JVD  Back:     Symmetric, no curvature, ROM normal, no CVA tenderness  Lungs:     Clear to auscultation bilaterally, respirations unlabored  Chest Wall:    No tenderness or deformity   Heart:    Regular rate  and rhythm, S1 and S2 normal, no murmur, rub   or gallop  Breast Exam:    No tenderness, masses, or nipple abnormality  Abdomen:     Soft, non-tender, bowel sounds active all four quadrants,    no masses, no organomegaly  Genitalia:    Normal female without lesion, discharge or tenderness  Rectal:    Normal tone, normal prostate, no masses or tenderness;   guaiac negative stool  Extremities:   Extremities normal, atraumatic, no cyanosis or edema  Pulses:   2+ and symmetric all extremities  Skin:   Skin color, texture, turgor normal, no rashes or lesions  Lymph nodes:   Cervical, supraclavicular, and axillary nodes normal  Neurologic:   CNII-XII intact, normal strength, sensation and reflexes    throughout    Predictors of intubation difficulty:  Morbid obesity? no  Anatomically abnormal facies? no  Prominent incisors? no  Receding mandible? no  Short, thick neck? no  Neck range of motion: normal  Dentition: No chipped, loose, or missing teeth.  Cardiographics ECG: normal sinus rhythm, no blocks or conduction defects, no ischemic changes   Imaging Chest x-ray: pending results , ordered today.   Lab Review  Admission on 12/17/2022, Discharged on 12/17/2022  Component Date Value   SURGICAL PATHOLOGY 12/17/2022                     Value:SURGICAL PATHOLOGY CASE: [id number] PATIENT: Christine Miranda Surgical Pathology  Report     Specimen Submitted: A. Stomach, random; cbx B. Stomach polyp; cbx C. Colon polyp, transverse; cold snare  Clinical History: Colonoscopy Z12.11, screening EGD LUQ pain R10.12. Gastric polyp, gastric erythema, colon polyp      DIAGNOSIS: A.  STOMACH, RANDOM; BIOPSIES: - GASTRIC MUCOSA WITH NO SPECIFIC PATHOLOGIC ABNORMALITY. - NO SIGNIFICANT INTESTINAL METAPLASIA, DYSPLASIA OR GLANDULAR ATROPHY. - NO ORGANISMS OF HELICOBACTER PYLORI SEEN WITH HE STAINED SECTION.  B.  STOMACH, POLYP; BIOPSY: - POLYPOID GASTRIC MUCOSA WITH FEATURES  SUGGESTIVE OF A SMALL DEVELOPING HYPERPLASTIC (REGENERATIVE) POLYP. - NO SIGNIFICANT INTESTINAL METAPLASIA, DYSPLASIA OR GLANDULAR ATROPHY. - NO ORGANISMS OF HELICOBACTER PYLORI SEEN WITH HE STAINED SECTION.  C.  COLON, TRANSVERSE, POLYP; COLD SNARE BIOPSIES:- MULTIPLE FRAGMENTS OF TUBULAR ADENOMA. - NO EVIDENCE OF HIGH-GRADE DYSPLASIA                          OR MALIGNANCY.  GROSS DESCRIPTION: A. Labeled: cbx random stomach for gastric erythema Received: Formalin Collection time: 9:17 AM on 12/17/2022 Placed into formalin time: 9:17 AM on 12/17/2022 Tissue fragment(s): 4 Size: Aggregate, 0.8 x 0.4 x 0.2 cm Description: Tan-pink soft tissue fragments Entirely submitted in 1 cassette.  B. Labeled: cbx gastric polyp Received: Formalin Collection time: 9:29 AM on 12/17/2022 Placed into formalin time: 9:29 AM on 12/17/2022 Tissue fragment(s): 1 Size: 0.4 x 0.3 x 0.2 cm Description: Tan-pink soft tissue fragment Entirely submitted in 1 cassette.  C. Labeled: Cold snare transverse colon polyp Received: Formalin Collection time: 9:46 AM on 12/17/2022 Placed into formalin time: 9:46 AM on 12/17/2022 Tissue fragment(s): Multiple Size: Aggregate, 2.8 x 0.5 x 0.1 cm Description: Received are fragments of tan-pink soft tissue admixed with intestinal debris.  The ratio of soft tissue to intestinal debris is 80: 20. Entirely                          submitted in 1 cassette.  RB 12/17/2022  Final Diagnosis performed by Theodora Blow, MD.   Electronically signed 12/18/2022 10:36:16AM The electronic signature indicates that the named Attending Pathologist has evaluated the specimen Technical component performed at [facility], [address] [place], [place] [address] Lab: [phone] Dir: Rush Farmer, MD, MMM  Professional component performed at [facility], [facility], [place], [place], [place] [address] Lab: [phone] Dir: Kathi Simpers, MD        Assessment:      52 y.o. female with planned surgery as above.   Known risk factors for perioperative complications: None   Difficulty with intubation is not anticipated.  Cardiac Risk Estimation: 0     Plan:    1. Preoperative workup as follows chest x-ray, ECG, hemoglobin, hematocrit, electrolytes, creatinine, glucose, liver function studies, coagulation studies, urinalysis (urinary tract instrumentation planned). 2. Change in medication regimen before surgery:  at surgeon discretion . 3. Prophylaxis for cardiac events with perioperative beta-blockers: not indicated. 4. Invasive hemodynamic monitoring perioperatively: not indicated. 5. Deep vein thrombosis prophylaxis postoperatively:regimen to be chosen by surgical team. 6. Surveillance for postoperative MI with ECG immediately postoperatively and on postoperative days 1 and 2 AND troponin levels 24 hours postoperatively and on day 4 or hospital discharge (whichever comes first): at the discretion of anesthesiologist.  Subjective:     Christine Miranda is a 52 y.o. female.  The pain is described as sharp and shooting and is 6/10 in intensity.  Pain pattern is is  constant Onset was chronic. Symptoms have been unchanged since. Aggravating factors: lying down.  Alleviating factors: pain medication. Associated symptoms: The patient denies other associated symptoms..   Patient History:  The following portions of the patient's history were reviewed and updated as appropriate: allergies, current medications, past family history, past medical history, past social history, past surgical history, and problem list.  Review of Systems Pertinent items are noted in HPI.    Objective:    BP 132/82   Pulse 92   Ht '5\' 5"'$  (1.651 m)   Wt 179 lb (81.2 kg)   SpO2 98%   BMI 29.79 kg/m   General:  alert and cooperative  Skin:  normal  Eyes: conjunctivae/corneas clear. PERRL, EOM's intact. Fundi benign.  Mouth: MMM no lesions  Lymph Nodes:   Cervical, supraclavicular, and axillary nodes normal.  Lungs:  clear to auscultation bilaterally  Heart:  regular rate and rhythm, S1, S2 normal, no murmur, click, rub or gallop  Abdomen: soft, non-tender; bowel sounds normal; no masses,  no organomegaly  CVA:  absent  Genitourinary: defer exam  Extremities:  extremities normal, atraumatic, no cyanosis or edema  Neurologic:  negative and Alert and oriented x3. Gait normal. Reflexes and motor strength normal and symmetric. Cranial nerves 2-12 and sensation grossly intact.  Psychiatric:  normal mood, behavior, speech, dress, and thought processes     Assessment:    .    Plan:    1. Discussed the risk of surgery,  and the risks of general anesthetic including MI, CVA, sudden death or even reaction to anesthetic medications. The patient understands the risks, any and all questions were answered to the patient's satisfaction. Problem List Items Addressed This Visit       Other   Chronic back pain   Relevant Medications   methocarbamol (ROBAXIN) 500 MG tablet   History of colon polyps   Preoperative examination    No chance of pregnancy as pt abdominal hysterectomy age 17.  EKG cxr ordered today Cbc cmp pt inr and urine culture ordered today and pending Once results received, pending clearance approval  Addendum 01/08/23 Reviewing CBC CMP PT INR Urinalysis, CXR and EKG pt is cleared for surgery from a primary care perspective.        Relevant Orders   Urinalysis, Routine w reflex microscopic (Completed)   Protime-INR (Completed)   Comprehensive metabolic panel (Completed)   CBC (Completed)   Other Visit Diagnoses     Pre-op exam    -  Primary   Relevant Orders   EKG 12-Lead (Completed)   DG Chest X-Ray (CXR) PA & lateral (Completed)

## 2023-01-16 DIAGNOSIS — L400 Psoriasis vulgaris: Secondary | ICD-10-CM | POA: Diagnosis not present

## 2023-01-18 DIAGNOSIS — Z4542 Encounter for adjustment and management of neuropacemaker (brain) (peripheral nerve) (spinal cord): Secondary | ICD-10-CM | POA: Diagnosis not present

## 2023-01-18 DIAGNOSIS — T85192A Other mechanical complication of implanted electronic neurostimulator (electrode) of spinal cord, initial encounter: Secondary | ICD-10-CM | POA: Diagnosis not present

## 2023-01-18 DIAGNOSIS — M5451 Vertebrogenic low back pain: Secondary | ICD-10-CM | POA: Diagnosis not present

## 2023-01-24 ENCOUNTER — Other Ambulatory Visit: Payer: Self-pay | Admitting: Family

## 2023-01-24 DIAGNOSIS — I10 Essential (primary) hypertension: Secondary | ICD-10-CM

## 2023-02-13 ENCOUNTER — Ambulatory Visit: Payer: 59 | Attending: Internal Medicine | Admitting: Internal Medicine

## 2023-02-13 ENCOUNTER — Encounter: Payer: Self-pay | Admitting: Internal Medicine

## 2023-02-13 VITALS — BP 131/82 | HR 96 | Resp 15 | Ht 65.0 in | Wt 179.0 lb

## 2023-02-13 DIAGNOSIS — M5442 Lumbago with sciatica, left side: Secondary | ICD-10-CM

## 2023-02-13 DIAGNOSIS — M2550 Pain in unspecified joint: Secondary | ICD-10-CM

## 2023-02-13 DIAGNOSIS — M5441 Lumbago with sciatica, right side: Secondary | ICD-10-CM | POA: Diagnosis not present

## 2023-02-13 DIAGNOSIS — L409 Psoriasis, unspecified: Secondary | ICD-10-CM | POA: Diagnosis not present

## 2023-02-13 DIAGNOSIS — R768 Other specified abnormal immunological findings in serum: Secondary | ICD-10-CM

## 2023-02-13 DIAGNOSIS — G8929 Other chronic pain: Secondary | ICD-10-CM | POA: Diagnosis not present

## 2023-02-13 NOTE — Progress Notes (Signed)
Office Visit Note  Patient: Christine Miranda             Date of Birth: 1971-04-20           MRN: [id number]             PCP: Eugenia Pancoast, FNP Referring: Eugenia Pancoast, FNP Visit Date: 02/13/2023   Subjective:  New Patient (Initial Visit) (R/U RA and/or Psoriatic arthritis, Previous saw Dr. Allyson Sabal and Dr. Pearline Cables)   History of Present Illness: Christine Miranda is a 52 y.o. female here for evaluation of chronic joint pain involving bilateral hands with positive ANA and psoriasis on skyrizi and family history of rheumatoid arthritis.  She has a very longstanding history of psoriasis dating back to teenage years with both gout Tate and plaque patterns of skin disease activity.  This has been varying in severity flaring up quite a bit during other associated stressors.  Joint pain has been also longstanding but was attributed more to athletics or minor injuries when early on years ago.  She has had issues with plantar fasciitis but mostly recalls this in her 60s and 26s and not so much lately.  She has noticed some bony changes and nodules at the DIP joints on her hands.  She also had some episodic diffuse or sausage type digit swelling most recently this occurred involving the fourth and fifth fingers of her left hand.  The last episode she recalls seeing this type of swelling was shortly before starting on Skyrizi.  Over that she was taking Humira which was very effective but she discontinued the medicine around 3 to 4 years ago due to lapse in insurance coverage and medication access.   Labs reviewed ANA 1:40 cytoplasmic RF neg ESR 11  Activities of Daily Living:  Patient reports morning stiffness for 2 hours.   Patient Reports nocturnal pain.  Difficulty dressing/grooming: Denies Difficulty climbing stairs: Reports Difficulty getting out of chair: Reports Difficulty using hands for taps, buttons, cutlery, and/or writing: Denies  Review of Systems  Constitutional:  Positive  for fatigue.  HENT:  Positive for mouth dryness. Negative for mouth sores.   Eyes:  Positive for dryness.  Respiratory:  Negative for shortness of breath.   Cardiovascular:  Positive for palpitations. Negative for chest pain.  Gastrointestinal:  Positive for constipation and diarrhea. Negative for blood in stool.  Endocrine: Negative for increased urination.  Genitourinary:  Positive for involuntary urination.  Musculoskeletal:  Positive for joint pain, joint pain, joint swelling, myalgias, morning stiffness, muscle tenderness and myalgias. Negative for gait problem and muscle weakness.  Skin:  Positive for sensitivity to sunlight. Negative for color change, rash and hair loss.  Allergic/Immunologic: Positive for susceptible to infections.  Neurological:  Positive for headaches. Negative for dizziness.  Hematological:  Positive for swollen glands.  Psychiatric/Behavioral:  Positive for sleep disturbance. Negative for depressed mood. The patient is nervous/anxious.     PMFS History:  Patient Active Problem List   Diagnosis Date Noted   History of colon polyps 01/03/2023   Preoperative examination 01/03/2023   Gastric erythema 12/17/2022   Polyp of transverse colon 12/17/2022   Elevated lipase 11/13/2022   Positive ANA (antinuclear antibody) 11/08/2022   Polyarthralgia 11/02/2022   Overweight (BMI 25.0-29.9) 11/02/2022   Primary hypertension 09/12/2022   Hypertriglyceridemia 05/31/2022   Depression, major, single episode, mild (Combine) 05/31/2022   ADHD 05/31/2022   Menopause 05/31/2022   Multinodular goiter 08/16/2017   Psoriasis 12/17/2016   Moderate persistent asthma,  uncomplicated [id number]   Allergic rhinitis 09/18/2016   Chronic back pain 07/30/2016   Migraines 07/30/2016    Past Medical History:  Diagnosis Date   Allergic rhinitis    Asthma    Eczema    Lower half migraine    Pelvic fracture ([place])     Family History  Problem Relation Age of Onset   Hyperlipidemia  Mother    Hypertension Mother    Rheum arthritis Mother    Diabetes Mother    Hyperlipidemia Father    Deep vein thrombosis Father        Provoked   Pulmonary embolism Father        Provoked   Diabetes Father    Arthritis Maternal Aunt    Asthma Maternal Grandmother    Diabetes Maternal Grandmother    Asthma Son    Eczema Son    Allergic rhinitis Neg Hx    Angioedema Neg Hx    Immunodeficiency Neg Hx    Urticaria Neg Hx    Past Surgical History:  Procedure Laterality Date   ABDOMINAL HYSTERECTOMY     partial, still with left ovary. 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   arm surgery     CHOLECYSTECTOMY     COLONOSCOPY WITH PROPOFOL N/A 12/17/2022   Procedure: COLONOSCOPY WITH PROPOFOL;  Surgeon: Lin Landsman, MD;  Location: [facility] ENDOSCOPY;  Service: Gastroenterology;  Laterality: N/A;   ELBOW SURGERY     ESOPHAGOGASTRODUODENOSCOPY (EGD) WITH PROPOFOL N/A 12/17/2022   Procedure: ESOPHAGOGASTRODUODENOSCOPY (EGD) WITH PROPOFOL;  Surgeon: Lin Landsman, MD;  Location: [facility];  Service: Gastroenterology;  Laterality: N/A;   HAND SURGERY     KNEE SURGERY     x 2   OVARY SURGERY     PELVIC SYMPHYSIS FUSION     SPINAL CORD STIMULATOR IMPLANT     Social History   Social History Narrative   Originally from [place], [place]. Has lived in [place], [place], [place], [place]. Moved to [place] in 1996. She has 2 [employer]. She does have some exposure to cleaning chemical fumes. Has a cat. No bird, mold, or hot tub exposure. Enjoys shopping.    Immunization History  Administered Date(s) Administered   Influenza,inj,Quad PF,6+ Mos 11/14/2016     Objective: Vital Signs: BP 138/83 (BP Location: Right Arm, Patient Position: Sitting, Cuff Size: Normal)   Pulse 91   Resp 15   Ht 5' 5"$  (1.651 m)   Wt 179 lb (81.2 kg)   BMI 29.79 kg/m    Physical Exam HENT:     Mouth/Throat:     Mouth: Mucous membranes are moist.     Pharynx: Oropharynx is clear.  Eyes:      Conjunctiva/sclera: Conjunctivae normal.  Cardiovascular:     Rate and Rhythm: Normal rate and regular rhythm.  Pulmonary:     Effort: Pulmonary effort is normal.     Breath sounds: Normal breath sounds.  Musculoskeletal:     Right lower leg: No edema.     Left lower leg: No edema.  Lymphadenopathy:     Cervical: No cervical adenopathy.  Skin:    General: Skin is warm and dry.     Findings: Rash present.     Comments: Small erythematous macule ~1cm diameter with scale on right occiput Blanching erythematous skin on upper chest with no scaling or induration  Neurological:     Mental Status: She is alert.  Psychiatric:        Mood and Affect: Mood normal.  Musculoskeletal Exam:  Shoulders full ROM no tenderness or swelling Elbows full ROM no tenderness or swelling Wrists full ROM no tenderness or swelling Fingers full ROM no tenderness or swelling, left 5th DIP heberdons nodes Mild lateral hip tenderness and low back paraspinal tenderness, less over sacrum, Knees full ROM no tenderness or swelling Ankles full ROM no tenderness or swelling, bony nodule at achilles insertion without tenderness MTPs full ROM no tenderness or swelling   Investigation: No additional findings.  Imaging: No results found.  Recent Labs: Lab Results  Component Value Date   WBC 5.1 01/03/2023   HGB 14.6 01/03/2023   PLT 259.0 01/03/2023   NA 139 01/03/2023   K 3.4 (L) 01/03/2023   CL 101 01/03/2023   CO2 29 01/03/2023   GLUCOSE 108 (H) 01/03/2023   BUN 13 01/03/2023   CREATININE 0.56 01/03/2023   BILITOT 0.8 01/03/2023   ALKPHOS 65 01/03/2023   AST 27 01/03/2023   ALT 34 01/03/2023   PROT 7.7 01/03/2023   ALBUMIN 4.6 01/03/2023   CALCIUM 9.9 01/03/2023   GFRAA >90 07/14/2013    Speciality Comments: No specialty comments available.  Procedures:  No procedures performed Allergies: Hydrocodone, Iodine, Prednisone, and Tincture of benzoin [benzoin]   Assessment / Plan:      Visit Diagnoses: Positive ANA (antinuclear antibody)  Low positive ANA without specific clinical criteria on exam findings do not recommend any current adjustment to treatment or monitoring plan.  Psoriasis  Skin disease activity appears well-controlled on Skyrizi maintenance regimen.  On exam today shortly before the dose is due so not seeing diminishing benefit during the treatment interval.  I see no peripheral joint synovitis on exam.  History sounds very consistent with psoriatic arthritis but seems most likely this is being well-controlled on her current treatment regimen I do not recommend adding additional medication or change at this time.  We can continue to follow-up for monitoring.  Orders: No orders of the defined types were placed in this encounter.  No orders of the defined types were placed in this encounter.    Follow-Up Instructions: No follow-ups on file.   Collier Salina, MD  Note - This record has been created using [employer].  Chart creation errors have been sought, but may not always  have been located. Such creation errors do not reflect on  the standard of medical care.

## 2023-02-15 ENCOUNTER — Other Ambulatory Visit: Payer: Self-pay | Admitting: Family

## 2023-05-07 ENCOUNTER — Ambulatory Visit: Payer: 59 | Admitting: Family

## 2023-05-10 ENCOUNTER — Encounter: Payer: Self-pay | Admitting: Family

## 2023-05-10 ENCOUNTER — Ambulatory Visit (INDEPENDENT_AMBULATORY_CARE_PROVIDER_SITE_OTHER): Payer: 59 | Admitting: Family

## 2023-05-10 VITALS — BP 138/80 | HR 90 | Temp 97.6°F | Ht 65.0 in | Wt 179.6 lb

## 2023-05-10 DIAGNOSIS — I10 Essential (primary) hypertension: Secondary | ICD-10-CM | POA: Diagnosis not present

## 2023-05-10 DIAGNOSIS — M5441 Lumbago with sciatica, right side: Secondary | ICD-10-CM

## 2023-05-10 DIAGNOSIS — G8929 Other chronic pain: Secondary | ICD-10-CM | POA: Diagnosis not present

## 2023-05-10 DIAGNOSIS — S3992XA Unspecified injury of lower back, initial encounter: Secondary | ICD-10-CM | POA: Diagnosis not present

## 2023-05-10 DIAGNOSIS — M5442 Lumbago with sciatica, left side: Secondary | ICD-10-CM | POA: Diagnosis not present

## 2023-05-10 MED ORDER — VALSARTAN-HYDROCHLOROTHIAZIDE 320-25 MG PO TABS: 1.0000 | ORAL_TABLET | Freq: Every day | ORAL | 3 refills | Status: DC

## 2023-05-10 MED ORDER — AMLODIPINE BESYLATE 5 MG PO TABS: 5.0000 mg | ORAL_TABLET | Freq: Every day | ORAL | 3 refills | Status: DC

## 2023-05-10 MED ORDER — METHOCARBAMOL 500 MG PO TABS: ORAL_TABLET | ORAL | 0 refills | Status: DC

## 2023-05-10 NOTE — Assessment & Plan Note (Signed)
Stop losartan HCTZ  Start valsartan HCTZ  Decrease amlodipine to 5 mg once daily  Wear compre

## 2023-05-10 NOTE — Progress Notes (Signed)
Established Patient Office Visit  Subjective:   Patient ID: Christine Miranda, female    DOB: August 19, 1971  Age: 52 y.o. MRN: [id number]  CC:  Chief Complaint  Patient presents with   Medical Management of Chronic Issues    HPI: Christine Miranda is a 52 y.o. female presenting on 05/10/2023 for Medical Management of Chronic Issues  HPI  HTN: on amlodipine 10 mg once daily. She has noticed increased edema  on her ankles as of late.   States that she was on the stairs, slipped and landed mainly on her left side especially on her left lower buttock and has bruising in that area as well.  She is feeling tenderness, and muscle spasms.         ROS: Negative unless specifically indicated above in HPI.   Relevant past medical history reviewed and updated as indicated.   Allergies and medications reviewed and updated.   Current Outpatient Medications:    albuterol (VENTOLIN HFA) 108 (90 Base) MCG/ACT inhaler, Inhale 1-2 puffs into the lungs as needed., Disp: , Rfl:    amLODipine (NORVASC) 5 MG tablet, Take 1 tablet (5 mg total) by mouth daily., Disp: 90 tablet, Rfl: 3   SKYRIZI PEN 150 MG/ML SOAJ, Inject 150 mg into the skin every 3 (three) months., Disp: , Rfl:    valsartan-hydrochlorothiazide (DIOVAN-HCT) 320-25 MG tablet, Take 1 tablet by mouth daily., Disp: 90 tablet, Rfl: 3   methocarbamol (ROBAXIN) 500 MG tablet, Take one po tid prn muscle spasms, Disp: 30 tablet, Rfl: 0  Allergies  Allergen Reactions   Hydrocodone Hives   Iodine Hives   Prednisone     Rapid heartrate   Tincture Of Benzoin [Benzoin]     Blister    Objective:   BP 138/80   Pulse 90   Temp 97.6 F (36.4 C) (Temporal)   Ht 5\' 5"  (1.651 m)   Wt 179 lb 9.6 oz (81.5 kg)   SpO2 98%   BMI 29.89 kg/m    Physical Exam Vitals reviewed.  Constitutional:      General: She is not in acute distress.    Appearance: Normal appearance. She is normal weight. She is not ill-appearing, toxic-appearing  or diaphoretic.  HENT:     Head: Normocephalic.  Cardiovascular:     Rate and Rhythm: Normal rate and regular rhythm.  Pulmonary:     Effort: Pulmonary effort is normal.     Breath sounds: Normal breath sounds.  Musculoskeletal:        General: Normal range of motion.     Right lower leg: 1+ Edema present.     Left lower leg: 1+ Edema present.  Skin:    Comments: Ecchymosis and tenderness on left lower buttock/back area.  Also with some mild edema as well.   Neurological:     General: No focal deficit present.     Mental Status: She is alert and oriented to person, place, and time. Mental status is at baseline.  Psychiatric:        Mood and Affect: Mood normal.        Behavior: Behavior normal.        Thought Content: Thought content normal.        Judgment: Judgment normal.     Assessment & Plan:  Primary hypertension Assessment & Plan: Stop losartan HCTZ  Start valsartan HCTZ  Decrease amlodipine to 5 mg once daily  Wear compre  Orders: -     Methocarbamol; Take  one po tid prn muscle spasms  Dispense: 30 tablet; Refill: 0 -     Valsartan-hydroCHLOROthiazide; Take 1 tablet by mouth daily.  Dispense: 90 tablet; Refill: 3 -     amLODIPine Besylate; Take 1 tablet (5 mg total) by mouth daily.  Dispense: 90 tablet; Refill: 3  Chronic low back pain with bilateral sciatica, unspecified back pain laterality -     Methocarbamol; Take one po tid prn muscle spasms  Dispense: 30 tablet; Refill: 0  Lower back injury, initial encounter Assessment & Plan: Muscle spasm and bruising Heat/ice to site Lidocaine patch prn  Ibuprofen/tylenol combo prn  Rx robaxin tid prn muscle spasm       Follow up plan: Return in about 3 months (around 08/10/2023) for f/u blood pressure.  Mort Sawyers, FNP

## 2023-05-10 NOTE — Patient Instructions (Addendum)
  Stop losartan HCTZ , start valsartan HCTZ  Decrease amlodipine 5 mg once daily   Maintain log of your blood pressure let me know in a week or two how it is looking.   Wear compression stockings while driving.    Regards,   Mort Sawyers FNP-C

## 2023-05-10 NOTE — Assessment & Plan Note (Signed)
Muscle spasm and bruising Heat/ice to site Lidocaine patch prn  Ibuprofen/tylenol combo prn  Rx robaxin tid prn muscle spasm

## 2023-05-21 ENCOUNTER — Encounter: Payer: Self-pay | Admitting: Family

## 2023-05-21 DIAGNOSIS — I10 Essential (primary) hypertension: Secondary | ICD-10-CM

## 2023-05-22 MED ORDER — VALSARTAN 320 MG PO TABS: 320.0000 mg | ORAL_TABLET | Freq: Every day | ORAL | 3 refills | Status: DC

## 2023-05-22 MED ORDER — TRIAMTERENE-HCTZ 37.5-25 MG PO TABS: 1.0000 | ORAL_TABLET | Freq: Every day | ORAL | 3 refills | Status: DC

## 2023-06-03 ENCOUNTER — Encounter: Payer: Self-pay | Admitting: Family

## 2023-08-15 ENCOUNTER — Ambulatory Visit (INDEPENDENT_AMBULATORY_CARE_PROVIDER_SITE_OTHER): Payer: 59 | Admitting: Family

## 2023-08-15 ENCOUNTER — Encounter: Payer: Self-pay | Admitting: Family

## 2023-08-15 VITALS — BP 130/78 | HR 99 | Temp 98.7°F | Ht 65.0 in | Wt 185.0 lb

## 2023-08-15 DIAGNOSIS — Z8742 Personal history of other diseases of the female genital tract: Secondary | ICD-10-CM | POA: Diagnosis not present

## 2023-08-15 DIAGNOSIS — I10 Essential (primary) hypertension: Secondary | ICD-10-CM

## 2023-08-15 DIAGNOSIS — E781 Pure hyperglyceridemia: Secondary | ICD-10-CM

## 2023-08-15 DIAGNOSIS — Z8249 Family history of ischemic heart disease and other diseases of the circulatory system: Secondary | ICD-10-CM

## 2023-08-15 DIAGNOSIS — E876 Hypokalemia: Secondary | ICD-10-CM

## 2023-08-15 DIAGNOSIS — E559 Vitamin D deficiency, unspecified: Secondary | ICD-10-CM | POA: Diagnosis not present

## 2023-08-15 DIAGNOSIS — E6609 Other obesity due to excess calories: Secondary | ICD-10-CM

## 2023-08-15 DIAGNOSIS — R748 Abnormal levels of other serum enzymes: Secondary | ICD-10-CM

## 2023-08-15 DIAGNOSIS — Z6830 Body mass index (BMI) 30.0-30.9, adult: Secondary | ICD-10-CM | POA: Diagnosis not present

## 2023-08-15 LAB — MICROALBUMIN / CREATININE URINE RATIO
Creatinine,U: 36.2 mg/dL
Microalb Creat Ratio: 1.9 mg/g (ref 0.0–30.0)
Microalb, Ur: <0.7 mg/dL (ref 0.0–1.9)

## 2023-08-15 LAB — TSH: TSH: 2.61 u[IU]/mL (ref 0.35–5.50)

## 2023-08-15 LAB — LIPID PANEL
Cholesterol: 173 mg/dL (ref 0–200)
HDL: 30.6 mg/dL — ABNORMAL LOW (ref >39.00)
Total CHOL/HDL Ratio: 6
Triglycerides: 480 mg/dL — ABNORMAL HIGH (ref 0.0–149.0)

## 2023-08-15 LAB — VITAMIN D 25 HYDROXY (VIT D DEFICIENCY, FRACTURES): VITD: 20.24 ng/mL — ABNORMAL LOW (ref 30.00–100.00)

## 2023-08-15 LAB — LDL CHOLESTEROL, DIRECT: Direct LDL: 84 mg/dL

## 2023-08-15 LAB — HEMOGLOBIN A1C: Hgb A1c MFr Bld: 5.4 % (ref 4.6–6.5)

## 2023-08-15 LAB — LIPASE: Lipase: 52 U/L (ref 11.0–59.0)

## 2023-08-15 MED ORDER — METFORMIN HCL 500 MG PO TABS: 500.0000 mg | ORAL_TABLET | Freq: Two times a day (BID) | ORAL | 3 refills | Status: DC

## 2023-08-15 NOTE — Assessment & Plan Note (Signed)
Ordered lipid panel, pending results. Work on low cholesterol diet and exercise as tolerated  

## 2023-08-15 NOTE — Progress Notes (Signed)
Established Patient Office Visit  Subjective:      CC:  Chief Complaint  Patient presents with   Hypertension    Follow up     HPI: Christine Miranda is a 52 y.o. female presenting on 08/15/2023 for Hypertension (Follow up )  HTN: was prescribed amlodipine 5 mg, triamterene hydrochlorothiazide 37.5 25 mg as well as valsartan 320 mg; however she states has only been taking valsartan 320 and triamt hydrochlorothiazide , she was confused and didn't realize that she was supposed to still be taking amlodipine. Average at is around 136/140 over 80.    Wt Readings from Last 3 Encounters:  08/15/23 185 lb (83.9 kg)  05/10/23 179 lb 9.6 oz (81.5 kg)  02/13/23 179 lb (81.2 kg)      Social history:  Relevant past medical, surgical, family and social history reviewed and updated as indicated. Interim medical history since our last visit reviewed.  Allergies and medications reviewed and updated.  DATA REVIEWED: CHART IN EPIC     ROS: Negative unless specifically indicated above in HPI.    Current Outpatient Medications:    albuterol (VENTOLIN HFA) 108 (90 Base) MCG/ACT inhaler, Inhale 1-2 puffs into the lungs as needed., Disp: , Rfl:    amLODipine (NORVASC) 5 MG tablet, Take 1 tablet (5 mg total) by mouth daily., Disp: 90 tablet, Rfl: 3   metFORMIN (GLUCOPHAGE) 500 MG tablet, Take 1 tablet (500 mg total) by mouth 2 (two) times daily with a meal., Disp: 180 tablet, Rfl: 3   SKYRIZI PEN 150 MG/ML SOAJ, Inject 150 mg into the skin every 3 (three) months., Disp: , Rfl:    triamterene-hydrochlorothiazide (MAXZIDE-25) 37.5-25 MG tablet, Take 1 tablet by mouth daily., Disp: 90 tablet, Rfl: 3   valsartan (DIOVAN) 320 MG tablet, Take 1 tablet (320 mg total) by mouth daily., Disp: 90 tablet, Rfl: 3      Objective:    BP 130/78   Pulse 99   Temp 98.7 F (37.1 C) (Temporal)   Ht 5\' 5"  (1.651 m)   Wt 185 lb (83.9 kg)   SpO2 98%   BMI 30.79 kg/m   Wt Readings from Last 3  Encounters:  08/15/23 185 lb (83.9 kg)  05/10/23 179 lb 9.6 oz (81.5 kg)  02/13/23 179 lb (81.2 kg)    Physical Exam Constitutional:      General: She is not in acute distress.    Appearance: Normal appearance. She is normal weight. She is not ill-appearing, toxic-appearing or diaphoretic.  HENT:     Head: Normocephalic.  Cardiovascular:     Rate and Rhythm: Normal rate and regular rhythm.  Pulmonary:     Effort: Pulmonary effort is normal.     Breath sounds: Normal breath sounds.  Musculoskeletal:        General: Normal range of motion.     Right lower leg: No edema.     Left lower leg: No edema.  Neurological:     General: No focal deficit present.     Mental Status: She is alert and oriented to person, place, and time. Mental status is at baseline.  Psychiatric:        Mood and Affect: Mood normal.        Behavior: Behavior normal.        Thought Content: Thought content normal.        Judgment: Judgment normal.           Assessment & Plan:  History of  PCOS Assessment & Plan: Start metformin 500 mg bid  Goal to help with weight regulation   Orders: -     metFORMIN HCl; Take 1 tablet (500 mg total) by mouth 2 (two) times daily with a meal.  Dispense: 180 tablet; Refill: 3 -     Hemoglobin A1c  Hypokalemia  Primary hypertension Assessment & Plan: Confusion on medication administration.  Clarified to take daily amlodipine 5 mg, valsartan 320 mg and also triamt-hydrochlorothiazide  Monitor blood pressure throughout the week and maintain average.    Orders: -     TSH -     Microalbumin / creatinine urine ratio  Vitamin D deficiency Assessment & Plan: Ordered vitamin d pending results.    Orders: -     VITAMIN D 25 Hydroxy (Vit-D Deficiency, Fractures)  Hypertriglyceridemia Assessment & Plan: Ordered lipid panel, pending results. Work on low cholesterol diet and exercise as tolerated   Orders: -     Lipid panel  Elevated lipase -      Lipase  Class 1 obesity due to excess calories with serious comorbidity and body mass index (BMI) of 30.0 to 30.9 in adult Assessment & Plan: Pt advised to work on diet and exercise as tolerated    Other orders -     LDL cholesterol, direct     Return in about 6 months (around 02/15/2024) for f/u CPE, f/u blood pressure, f/u cholesterol.  Mort Sawyers, MSN, APRN, FNP-C [facility]

## 2023-08-15 NOTE — Assessment & Plan Note (Signed)
Confusion on medication administration.  Clarified to take daily amlodipine 5 mg, valsartan 320 mg and also triamt-hydrochlorothiazide  Monitor blood pressure throughout the week and maintain average.

## 2023-08-15 NOTE — Patient Instructions (Addendum)
  Restart amlodipine 5 mg once daily  Continue valsartan 320 mg and continue triamt-hydrochlorothiazide  Stop by the lab prior to leaving today. I will notify you of your results once received.   Start metformin once in am for about one week then increase to twice daily.    Regards,   Mort Sawyers FNP-C

## 2023-08-15 NOTE — Assessment & Plan Note (Signed)
Pt advised to work on diet and exercise as tolerated  

## 2023-08-15 NOTE — Assessment & Plan Note (Signed)
Ordered vitamin d pending results.   

## 2023-08-15 NOTE — Assessment & Plan Note (Signed)
Start metformin 500 mg bid  Goal to help with weight regulation

## 2023-08-19 MED ORDER — PRAVASTATIN SODIUM 10 MG PO TABS: 10.0000 mg | ORAL_TABLET | Freq: Every day | ORAL | 3 refills | Status: DC

## 2023-08-19 NOTE — Addendum Note (Signed)
Addended by: Mort Sawyers on: 08/19/2023 04:30 PM   Modules accepted: Orders

## 2023-08-21 ENCOUNTER — Encounter: Payer: Self-pay | Admitting: Family

## 2023-08-21 NOTE — Addendum Note (Signed)
Addended by: Mort Sawyers on: 08/21/2023 02:44 PM   Modules accepted: Orders

## 2023-08-26 ENCOUNTER — Encounter: Payer: Self-pay | Admitting: Family

## 2023-09-19 ENCOUNTER — Encounter: Payer: Self-pay | Admitting: Family

## 2023-09-26 DIAGNOSIS — L400 Psoriasis vulgaris: Secondary | ICD-10-CM | POA: Diagnosis not present

## 2023-10-04 ENCOUNTER — Ambulatory Visit: Payer: 59 | Admitting: Internal Medicine

## 2023-10-23 ENCOUNTER — Encounter: Payer: Self-pay | Admitting: Family

## 2023-10-23 ENCOUNTER — Ambulatory Visit
Admission: RE | Admit: 2023-10-23 | Discharge: 2023-10-23 | Disposition: A | Discharge status: Home / Self Care | Payer: 59 | Source: Ambulatory Visit | Attending: Family | Admitting: Family

## 2023-10-23 ENCOUNTER — Ambulatory Visit (INDEPENDENT_AMBULATORY_CARE_PROVIDER_SITE_OTHER): Payer: 59 | Admitting: Family

## 2023-10-23 VITALS — BP 160/105 | HR 68 | Temp 97.7°F | Ht 65.0 in | Wt 181.4 lb

## 2023-10-23 DIAGNOSIS — R519 Headache, unspecified: Secondary | ICD-10-CM

## 2023-10-23 DIAGNOSIS — N39498 Other specified urinary incontinence: Secondary | ICD-10-CM

## 2023-10-23 DIAGNOSIS — Z20822 Contact with and (suspected) exposure to covid-19: Secondary | ICD-10-CM

## 2023-10-23 DIAGNOSIS — R55 Syncope and collapse: Secondary | ICD-10-CM

## 2023-10-23 DIAGNOSIS — E876 Hypokalemia: Secondary | ICD-10-CM | POA: Diagnosis not present

## 2023-10-23 DIAGNOSIS — R159 Full incontinence of feces: Secondary | ICD-10-CM

## 2023-10-23 LAB — CBC WITH DIFFERENTIAL/PLATELET
Basophils Absolute: 0.1 10*3/uL (ref 0.0–0.1)
Basophils Relative: 1 % (ref 0.0–3.0)
Eosinophils Absolute: 0.1 10*3/uL (ref 0.0–0.7)
Eosinophils Relative: 2.1 % (ref 0.0–5.0)
HCT: 46 % (ref 36.0–46.0)
Hemoglobin: 15.2 g/dL — ABNORMAL HIGH (ref 12.0–15.0)
Lymphocytes Relative: 33.5 % (ref 12.0–46.0)
Lymphs Abs: 2.3 10*3/uL (ref 0.7–4.0)
MCHC: 33 g/dL (ref 30.0–36.0)
MCV: 91.3 fL (ref 78.0–100.0)
Monocytes Absolute: 0.5 10*3/uL (ref 0.1–1.0)
Monocytes Relative: 7.8 % (ref 3.0–12.0)
Neutro Abs: 3.8 10*3/uL (ref 1.4–7.7)
Neutrophils Relative %: 55.6 % (ref 43.0–77.0)
Platelets: 287 10*3/uL (ref 150.0–400.0)
RBC: 5.04 Mil/uL (ref 3.87–5.11)
RDW: 13.1 % (ref 11.5–15.5)
WBC: 6.9 10*3/uL (ref 4.0–10.5)

## 2023-10-23 LAB — BASIC METABOLIC PANEL
BUN: 16 mg/dL (ref 6–23)
CO2: 24 meq/L (ref 19–32)
Calcium: 10.1 mg/dL (ref 8.4–10.5)
Chloride: 104 meq/L (ref 96–112)
Creatinine, Ser: 0.61 mg/dL (ref 0.40–1.20)
GFR: 101.96 mL/min (ref >60.00)
Glucose, Bld: 91 mg/dL (ref 70–99)
Potassium: 4.1 meq/L (ref 3.5–5.1)
Sodium: 140 meq/L (ref 135–145)

## 2023-10-23 LAB — IBC + FERRITIN
Ferritin: 114.8 ng/mL (ref 10.0–291.0)
Iron: 105 ug/dL (ref 42–145)
Saturation Ratios: 22.3 % (ref 20.0–50.0)
TIBC: 471.8 ug/dL — ABNORMAL HIGH (ref 250.0–450.0)
Transferrin: 337 mg/dL (ref 212.0–360.0)

## 2023-10-23 LAB — VITAMIN B12: Vitamin B-12: 321 pg/mL (ref 211–911)

## 2023-10-23 NOTE — Assessment & Plan Note (Addendum)
X 2 episodes.   Ordering cbc ibc ferritin bmp b12 r/o b12 def, anemia, and or electrolyte concerns. A1c reviewed 8/24, 5.4 Urine ordered today to r/o UTI Covid test negative.  Stat CT head ddx stroke vs neoplasm low likelihood hemorrhage as no trauma Referral to neurology for possible EEG  Already with referral to cardiology in place, reassuring EKG in office today with NSR  Advised if any repeat episode of syncope seizure like activity call 911.
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
## 2023-10-23 NOTE — Patient Instructions (Signed)
A referral was placed today for neurology Please let us know if you have not heard back within 2 weeks about the referral.  Keep track of blood pressure Make sure to eat every 2-3 hours, small snacks at least.

## 2023-10-24 ENCOUNTER — Encounter: Payer: Self-pay | Admitting: Family

## 2023-10-24 DIAGNOSIS — Z9689 Presence of other specified functional implants: Secondary | ICD-10-CM | POA: Insufficient documentation

## 2023-10-24 DIAGNOSIS — Z5309 Procedure and treatment not carried out because of other contraindication: Secondary | ICD-10-CM | POA: Insufficient documentation

## 2023-10-24 LAB — URINALYSIS W MICROSCOPIC + REFLEX CULTURE
Bacteria, UA: NONE SEEN /[HPF]
Bilirubin Urine: NEGATIVE
Glucose, UA: NEGATIVE
Hgb urine dipstick: NEGATIVE
Hyaline Cast: NONE SEEN /[LPF]
Ketones, ur: NEGATIVE
Leukocyte Esterase: NEGATIVE
Nitrites, Initial: NEGATIVE
Protein, ur: NEGATIVE
RBC / HPF: NONE SEEN /[HPF] (ref 0–2)
Specific Gravity, Urine: 1.016 (ref 1.001–1.035)
WBC, UA: NONE SEEN /[HPF] (ref 0–5)
pH: 6.5 (ref 5.0–8.0)

## 2023-10-24 LAB — NO CULTURE INDICATED

## 2023-10-25 ENCOUNTER — Ambulatory Visit: Payer: 59 | Admitting: Neurology

## 2023-10-25 ENCOUNTER — Encounter: Payer: Self-pay | Admitting: Neurology

## 2023-10-25 VITALS — BP 167/91 | HR 87 | Ht 65.5 in | Wt 184.0 lb

## 2023-10-25 DIAGNOSIS — R569 Unspecified convulsions: Secondary | ICD-10-CM
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
## 2023-10-25 NOTE — Patient Instructions (Addendum)
Routine EEG, if normal then will get 72 hours ambulatory  If any abnormality, will likely start antiseizure medication Continue to follow with PCP Return in 6 months or sooner if worse or any other concerns.

## 2023-10-27 DIAGNOSIS — Z79899 Other long term (current) drug therapy: Secondary | ICD-10-CM | POA: Diagnosis not present

## 2023-10-27 DIAGNOSIS — R519 Headache, unspecified: Secondary | ICD-10-CM | POA: Diagnosis not present

## 2023-10-27 DIAGNOSIS — Z79890 Hormone replacement therapy (postmenopausal): Secondary | ICD-10-CM | POA: Diagnosis not present

## 2023-10-27 DIAGNOSIS — I10 Essential (primary) hypertension: Secondary | ICD-10-CM | POA: Diagnosis not present

## 2023-10-27 DIAGNOSIS — R569 Unspecified convulsions: Secondary | ICD-10-CM | POA: Diagnosis not present

## 2023-10-28 ENCOUNTER — Telehealth: Payer: Self-pay

## 2023-10-28 ENCOUNTER — Encounter: Payer: Self-pay | Admitting: Neurology
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
## 2023-11-18 ENCOUNTER — Encounter: Payer: Self-pay | Admitting: Neurology

## 2023-11-18 ENCOUNTER — Other Ambulatory Visit: Payer: Self-pay

## 2023-11-18 ENCOUNTER — Other Ambulatory Visit: Payer: Self-pay | Admitting: Neurology

## 2023-11-18 DIAGNOSIS — R569 Unspecified convulsions: Secondary | ICD-10-CM

## 2023-11-18 MED ORDER — TOPIRAMATE 25 MG PO TABS: 25.0000 mg | ORAL_TABLET | Freq: Two times a day (BID) | ORAL | 3 refills | Status: DC

## 2023-11-18 MED ORDER — TOPIRAMATE 100 MG PO TABS: 100.0000 mg | ORAL_TABLET | Freq: Two times a day (BID) | ORAL | 3 refills | Status: DC

## 2023-11-18 NOTE — Telephone Encounter (Signed)
Medication sent to pharmacy. Can you please print lab requisition and sent it to patient.

## 2023-11-25 ENCOUNTER — Encounter: Payer: Self-pay | Admitting: Neurology

## 2023-11-27 ENCOUNTER — Ambulatory Visit: Payer: 59 | Admitting: Family

## 2023-11-27 ENCOUNTER — Encounter: Payer: Self-pay | Admitting: Family

## 2023-11-27 VITALS — BP 140/90 | HR 93 | Temp 98.0°F | Ht 65.0 in | Wt 177.0 lb

## 2023-11-27 DIAGNOSIS — N069 Isolated proteinuria with unspecified morphologic lesion: Secondary | ICD-10-CM | POA: Insufficient documentation

## 2023-11-27 DIAGNOSIS — Z5181 Encounter for therapeutic drug level monitoring: Secondary | ICD-10-CM | POA: Diagnosis not present

## 2023-11-27 DIAGNOSIS — R569 Unspecified convulsions: Secondary | ICD-10-CM | POA: Diagnosis not present

## 2023-11-27 DIAGNOSIS — R1012 Left upper quadrant pain: Secondary | ICD-10-CM | POA: Diagnosis not present

## 2023-11-27 DIAGNOSIS — R300 Dysuria: Secondary | ICD-10-CM

## 2023-11-27 DIAGNOSIS — R1032 Left lower quadrant pain: Secondary | ICD-10-CM | POA: Diagnosis not present

## 2023-11-27 DIAGNOSIS — R109 Unspecified abdominal pain: Secondary | ICD-10-CM

## 2023-11-27 LAB — CBC WITH DIFFERENTIAL/PLATELET
Basophils Absolute: 0.1 10*3/uL (ref 0.0–0.1)
Basophils Relative: 1.3 % (ref 0.0–3.0)
Eosinophils Absolute: 0.1 10*3/uL (ref 0.0–0.7)
Eosinophils Relative: 2.2 % (ref 0.0–5.0)
HCT: 42.5 % (ref 36.0–46.0)
Hemoglobin: 14.7 g/dL (ref 12.0–15.0)
Lymphocytes Relative: 38.8 % (ref 12.0–46.0)
Lymphs Abs: 1.9 10*3/uL (ref 0.7–4.0)
MCHC: 34.7 g/dL (ref 30.0–36.0)
MCV: 88.7 fL (ref 78.0–100.0)
Monocytes Absolute: 0.5 10*3/uL (ref 0.1–1.0)
Monocytes Relative: 9 % (ref 3.0–12.0)
Neutro Abs: 2.4 10*3/uL (ref 1.4–7.7)
Neutrophils Relative %: 48.7 % (ref 43.0–77.0)
Platelets: 273 10*3/uL (ref 150.0–400.0)
RBC: 4.79 Mil/uL (ref 3.87–5.11)
RDW: 12.8 % (ref 11.5–15.5)
WBC: 5 10*3/uL (ref 4.0–10.5)

## 2023-11-27 LAB — LIPASE: Lipase: 46 U/L (ref 11.0–59.0)

## 2023-11-27 LAB — POCT URINE DIPSTICK
Bilirubin, UA: NEGATIVE
Blood, UA: NEGATIVE
Glucose, UA: NEGATIVE mg/dL
Ketones, POC UA: NEGATIVE mg/dL
Leukocytes, UA: NEGATIVE
Nitrite, UA: NEGATIVE
Spec Grav, UA: 1.01 (ref 1.010–1.025)
Urobilinogen, UA: 0.2 U/dL
pH, UA: 8.5 — AB (ref 5.0–8.0)

## 2023-11-27 LAB — BASIC METABOLIC PANEL
BUN: 17 mg/dL (ref 6–23)
CO2: 26 meq/L (ref 19–32)
Calcium: 9.7 mg/dL (ref 8.4–10.5)
Chloride: 104 meq/L (ref 96–112)
Creatinine, Ser: 0.66 mg/dL (ref 0.40–1.20)
GFR: 99.98 mL/min (ref >60.00)
Glucose, Bld: 100 mg/dL — ABNORMAL HIGH (ref 70–99)
Potassium: 3.7 meq/L (ref 3.5–5.1)
Sodium: 140 meq/L (ref 135–145)

## 2023-11-27 LAB — AMYLASE: Amylase: 31 U/L (ref 27–131)

## 2023-11-27 MED ORDER — TAMSULOSIN HCL 0.4 MG PO CAPS: 0.4000 mg | ORAL_CAPSULE | Freq: Every day | ORAL | 3 refills | Status: DC

## 2023-11-27 MED ORDER — AMOXICILLIN-POT CLAVULANATE 875-125 MG PO TABS: 1.0000 | ORAL_TABLET | Freq: Two times a day (BID) | ORAL | 0 refills | Status: DC

## 2023-11-27 NOTE — Addendum Note (Signed)
Addended by: Mort Sawyers on: 11/27/2023 12:03 PM   Modules accepted: Orders

## 2023-11-27 NOTE — Progress Notes (Signed)
Established Patient Office Visit  Subjective:   Patient ID: Christine Miranda, female    DOB: Apr 15, 1971  Age: 52 y.o. MRN: [id number]  CC:  Chief Complaint  Patient presents with   Urinary Tract Infection    Reports burning with urination and lower abdominal pain x1-2 weeks.    HPI: Christine Miranda is a 52 y.o. female presenting on 11/27/2023 for Urinary Tract Infection (Reports burning with urination and lower abdominal pain x1-2 weeks.)  About ten days ago started with urinary frequency, pelvic pain, and dysuria.  Unsure if fever, felt flush the other day but didn't check, no chills, left flank pain mild. Peeing small amounts.   No vaginal discharge. No vaginal itching.  Is noticing constipation, Monday had a bowel movement, going with very small bowel movements, feels constipated.        ROS: Negative unless specifically indicated above in HPI.   Relevant past medical history reviewed and updated as indicated.   Allergies and medications reviewed and updated.   Current Outpatient Medications:    albuterol (VENTOLIN HFA) 108 (90 Base) MCG/ACT inhaler, Inhale 1-2 puffs into the lungs as needed., Disp: , Rfl:    amoxicillin-clavulanate (AUGMENTIN) 875-125 MG tablet, Take 1 tablet by mouth 2 (two) times daily., Disp: 20 tablet, Rfl: 0   metFORMIN (GLUCOPHAGE) 500 MG tablet, Take 1 tablet (500 mg total) by mouth 2 (two) times daily with a meal., Disp: 180 tablet, Rfl: 3   pravastatin (PRAVACHOL) 10 MG tablet, Take 1 tablet (10 mg total) by mouth daily., Disp: 90 tablet, Rfl: 3   SKYRIZI PEN 150 MG/ML SOAJ, Inject 150 mg into the skin every 3 (three) months., Disp: , Rfl:    tamsulosin (FLOMAX) 0.4 MG CAPS capsule, Take 1 capsule (0.4 mg total) by mouth daily., Disp: 30 capsule, Rfl: 3   topiramate (TOPAMAX) 100 MG tablet, Take 1 tablet (100 mg total) by mouth 2 (two) times daily., Disp: 180 tablet, Rfl: 3   topiramate (TOPAMAX) 25 MG tablet, Take 1 tablet (25 mg  total) by mouth 2 (two) times daily., Disp: 180 tablet, Rfl: 3   triamterene-hydrochlorothiazide (MAXZIDE-25) 37.5-25 MG tablet, Take 1 tablet by mouth daily., Disp: 90 tablet, Rfl: 3   valsartan (DIOVAN) 320 MG tablet, Take 1 tablet (320 mg total) by mouth daily., Disp: 90 tablet, Rfl: 3  Allergies  Allergen Reactions   Hydrocodone Hives   Iodine Hives   Pravastatin Other (See Comments)    achy   Prednisone     Rapid heartrate   Tincture Of Benzoin [Benzoin]     Blister    Objective:   BP (!) 140/90 (BP Location: Left Arm, Patient Position: Sitting, Cuff Size: Normal)   Pulse 93   Temp 98 F (36.7 C) (Temporal)   Ht 5\' 5"  (1.651 m)   Wt 177 lb (80.3 kg)   SpO2 96%   BMI 29.45 kg/m    Physical Exam Constitutional:      General: She is not in acute distress.    Appearance: Normal appearance. She is normal weight. She is not ill-appearing, toxic-appearing or diaphoretic.  Cardiovascular:     Rate and Rhythm: Normal rate.  Pulmonary:     Effort: Pulmonary effort is normal.  Abdominal:     General: Abdomen is flat. Bowel sounds are normal.     Tenderness: There is abdominal tenderness in the left upper quadrant and left lower quadrant. There is left CVA tenderness. There is no right CVA tenderness,  guarding or rebound. Negative signs include McBurney's sign.  Neurological:     General: No focal deficit present.     Mental Status: She is alert and oriented to person, place, and time. Mental status is at baseline.     Motor: No weakness.  Psychiatric:        Mood and Affect: Mood normal.        Behavior: Behavior normal.        Thought Content: Thought content normal.        Judgment: Judgment normal.     Assessment & Plan:  Burning with urination -     POCT URINE DIPSTICK -     Tamsulosin HCl; Take 1 capsule (0.4 mg total) by mouth daily.  Dispense: 30 capsule; Refill: 3  Dysuria Assessment & Plan: Ddx vaginal infection vs kidney infection vs UTI  Ordering wet  prep urinalysis culture pending results.   Orders: -     WET PREP BY MOLECULAR PROBE -     Tamsulosin HCl; Take 1 capsule (0.4 mg total) by mouth daily.  Dispense: 30 capsule; Refill: 3  Isolated proteinuria with morphologic lesion -     Urinalysis w microscopic + reflex cultur  Left upper quadrant abdominal pain -     CBC with Differential/Platelet -     Basic metabolic panel  Left lower quadrant abdominal pain Assessment & Plan: Ddx pancreatitis (low likelihood), diverticulitis, radiation from kidney stone and or constipation.  Will cover with augmentin for now.  Red flag symptoms discussed.  Labs ordered pending results.   Orders: -     CT ABDOMEN PELVIS WO CONTRAST; Future -     Amoxicillin-Pot Clavulanate; Take 1 tablet by mouth 2 (two) times daily.  Dispense: 20 tablet; Refill: 0 -     Amylase -     Lipase  Acute left flank pain Assessment & Plan: Concern for kidney stone.  Pt states she 'can not go today'  She does state she will go to the ER if red flag symptoms occur which we had discussed.  Sending urine for urinalysis culture     Seizures [facility]) Assessment & Plan: Will obtain topamax level today for neurology as getting labs already here.  Will send.   Orders: -     Topiramate level  Encounter for therapeutic drug level monitoring -     Topiramate level   Tamsulosin given in case kidney stone Advised to increase fluid intake.   Follow up plan: Return if symptoms worsen or fail to improve.  Mort Sawyers, FNP

## 2023-11-27 NOTE — Assessment & Plan Note (Signed)
Ddx pancreatitis (low likelihood), diverticulitis, radiation from kidney stone and or constipation.  Will cover with augmentin for now.  Red flag symptoms discussed.  Labs ordered pending results.

## 2023-11-27 NOTE — Assessment & Plan Note (Signed)
Concern for kidney stone.  Pt states she 'can not go today'  She does state she will go to the ER if red flag symptoms occur which we had discussed.  Sending urine for urinalysis culture

## 2023-11-27 NOTE — Assessment & Plan Note (Signed)
Will obtain topamax level today for neurology as getting labs already here.  Will send.

## 2023-11-27 NOTE — Addendum Note (Signed)
Addended by: Vincenza Hews on: 11/27/2023 08:31 AM   Modules accepted: Orders

## 2023-11-27 NOTE — Assessment & Plan Note (Signed)
Ddx vaginal infection vs kidney infection vs UTI  Ordering wet prep urinalysis culture pending results.

## 2023-11-28 LAB — URINALYSIS W MICROSCOPIC + REFLEX CULTURE
Bilirubin Urine: NEGATIVE
Glucose, UA: NEGATIVE
Hgb urine dipstick: NEGATIVE
Hyaline Cast: NONE SEEN /[LPF]
Ketones, ur: NEGATIVE
Leukocyte Esterase: NEGATIVE
Nitrites, Initial: NEGATIVE
Protein, ur: NEGATIVE
RBC / HPF: NONE SEEN /[HPF] (ref 0–2)
Specific Gravity, Urine: 1.014 (ref 1.001–1.035)
WBC, UA: NONE SEEN /[HPF] (ref 0–5)
pH: 8.5 — ABNORMAL HIGH (ref 5.0–8.0)

## 2023-11-28 LAB — NO CULTURE INDICATED

## 2023-11-29 LAB — WET PREP BY MOLECULAR PROBE
Candida species: NOT DETECTED
Gardnerella vaginalis: NOT DETECTED
MICRO NUMBER:: 15787834
SPECIMEN QUALITY:: ADEQUATE
Trichomonas vaginosis: NOT DETECTED

## 2023-11-30 LAB — TOPIRAMATE LEVEL: Topiramate Lvl: 11.2 ug/mL

## 2023-12-01 NOTE — Progress Notes (Signed)
Good evening,  Patient requesting I draw topiramate with other labs.

## 2023-12-03 ENCOUNTER — Ambulatory Visit (INDEPENDENT_AMBULATORY_CARE_PROVIDER_SITE_OTHER): Payer: 59 | Admitting: Neurology

## 2023-12-03 DIAGNOSIS — R569 Unspecified convulsions: Secondary | ICD-10-CM | POA: Diagnosis not present

## 2023-12-04 ENCOUNTER — Other Ambulatory Visit: Payer: Self-pay | Admitting: Family

## 2023-12-04 DIAGNOSIS — R12 Heartburn: Secondary | ICD-10-CM

## 2023-12-04 NOTE — Procedures (Signed)
    History:  52 year old woman with seizure disorder   EEG classification: Awake and drowsy  Duration: 26 minutes   Technical aspects: This EEG study was done with scalp electrodes positioned according to the 10-20 International system of electrode placement. Electrical activity was reviewed with band pass filter of 1-70Hz , sensitivity of 7 uV/mm, display speed of 70mm/sec with a 60Hz  notched filter applied as appropriate. EEG data were recorded continuously and digitally stored.   Description of the recording: The background rhythms of this recording consists of a low amplitude/voltage rhythm with no clear PDR. Present in the anterior head region is a 15-20 Hz beta activity. Photic stimulation was performed, did not show any abnormalities, showed good driving response. Hyperventilation was also performed, did not show any abnormalities. Drowsiness was manifested by background fragmentation. No abnormal epileptiform discharges seen during this recording. There was no focal slowing. There were no electrographic seizure identified.   Abnormality: None   Impression: This is essentially a normal EEG recorded while drowsy and awake. No evidence of interictal epileptiform discharges. Normal EEGs, however, do not rule out epilepsy.    Windell Norfolk, MD [facility]

## 2023-12-06 ENCOUNTER — Telehealth: Payer: Self-pay

## 2023-12-06 NOTE — Telephone Encounter (Signed)
New patient visit on 12/12/23.  Confirmed with patient no personal cardiac history.  Directions to the office provided.

## 2023-12-10 ENCOUNTER — Telehealth: Payer: Self-pay

## 2023-12-10 ENCOUNTER — Encounter: Payer: Self-pay | Admitting: Neurology

## 2023-12-10 NOTE — Telephone Encounter (Signed)
Patient sent MyChart message about having continued episodes. She reports medication compliance with topamax. She did recently pass a kidney stone. She states these episodes happen 2-3 times a week but she did have 2 this morning. She reports them as as tingling over her whole body, getting a weird taste and smell and then " the lights go out  and no one is there". She is aware of what is going on around her but can't respond and reports her brain feeling like static on a TV that is stuck. She can tell hours before they happen that they are coming. She also gets very emotional. I advised if these are happening back to back she needs to call 911. She verbalized understanding. She reports relief from migraines with Topamax so doesn't want to change that but would like to discuss increasing dose or adding a medication. Advised I would send to Dr. Teresa Coombs for review. Patient appreciative of call.

## 2023-12-11 ENCOUNTER — Telehealth: Payer: Self-pay

## 2023-12-11 MED ORDER — TOPIRAMATE 200 MG PO TABS: 200.0000 mg | ORAL_TABLET | Freq: Two times a day (BID) | ORAL | 0 refills | Status: DC

## 2023-12-11 NOTE — Telephone Encounter (Signed)
Call to [facility] spoke to Diu-ha, advised to discontinue Topamax 100 mg and 25 mg dose, Patient is increasing to 200 mg tablets. My chart message sent to patient.  She confirmed cancellation.

## 2023-12-11 NOTE — Telephone Encounter (Signed)
Please have patient increase topiramate to 200 mg twice daily and to continue monitoring her symptoms.

## 2023-12-12 ENCOUNTER — Ambulatory Visit: Payer: 59 | Attending: Internal Medicine | Admitting: Internal Medicine

## 2023-12-12 ENCOUNTER — Encounter: Payer: Self-pay | Admitting: Internal Medicine

## 2023-12-12 VITALS — BP 138/80 | HR 85 | Ht 65.0 in | Wt 175.4 lb

## 2023-12-12 DIAGNOSIS — E781 Pure hyperglyceridemia: Secondary | ICD-10-CM

## 2023-12-12 DIAGNOSIS — Z79899 Other long term (current) drug therapy: Secondary | ICD-10-CM

## 2023-12-12 DIAGNOSIS — I10 Essential (primary) hypertension: Secondary | ICD-10-CM | POA: Diagnosis not present

## 2023-12-12 NOTE — Patient Instructions (Signed)
Medication Instructions:  Your physician recommends that you continue on your current medications as directed. Please refer to the Current Medication list given to you today.   *If you need a refill on your cardiac medications before your next appointment, please call your pharmacy*   Lab Work: Your provider would like for you to return  to have the following labs drawn: (Lipid, ALT).   Please go to [facility] [address] ([facility]) [address], [place] [address] You do not need an appointment.  They are open from 7:30 am-4 pm.  Lunch from 1:00 pm- 2:00 pm You will need to be fasting.   Testing/Procedures: No test ordered today    Follow-Up: At [facility], you and your health needs are our priority.  As part of our continuing mission to provide you with exceptional heart care, we have created designated Provider Care Teams.  These Care Teams include your primary Cardiologist (physician) and Advanced Practice Providers (APPs -  Physician Assistants and Nurse Practitioners) who all work together to provide you with the care you need, when you need it.  We recommend signing up for the patient portal called "MyChart".  Sign up information is provided on this After Visit Summary.  MyChart is used to connect with patients for Virtual Visits (Telemedicine).  Patients are able to view lab/test results, encounter notes, upcoming appointments, etc.  Non-urgent messages can be sent to your provider as well.   To learn more about what you can do with MyChart, go to [email or web address].    Your next appointment:   3 month(s)  Provider:   You may see Yvonne Kendall, MD or one of the following Advanced Practice Providers on your designated Care Team:   Nicolasa Ducking, NP Eula Listen, PA-C Cadence Fransico Michael, PA-C Charlsie Quest, NP Carlos Levering, NP
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
## 2023-12-23 ENCOUNTER — Encounter: Payer: Self-pay | Admitting: Neurology

## 2023-12-23 ENCOUNTER — Telehealth: Payer: Self-pay

## 2023-12-23 NOTE — Telephone Encounter (Signed)
Returned call to patient after MyChart message regarding still having numbness and tingling in hands and face, nose and lip  but no full blown seizures. She is aware these sensations are her typical auras but the tingling in hands could also be due to increase of topamax of 200 mg. She does report seizure free since increase increased dose and is just wanting to let Dr. Teresa Coombs know. She is aware he is not in the office but is periodically checking his messages. She was appreciative of call.

## 2024-01-02 ENCOUNTER — Other Ambulatory Visit: Payer: Self-pay | Admitting: Neurology

## 2024-01-06 NOTE — Telephone Encounter (Signed)
 This is most likely side effect of the Topamax and should improve with time.

## 2024-01-29 DIAGNOSIS — L400 Psoriasis vulgaris: Secondary | ICD-10-CM | POA: Diagnosis not present

## 2024-02-06 DIAGNOSIS — L400 Psoriasis vulgaris: Secondary | ICD-10-CM | POA: Diagnosis not present

## 2024-03-13 ENCOUNTER — Ambulatory Visit: Payer: 59 | Admitting: Internal Medicine

## 2024-05-02 ENCOUNTER — Other Ambulatory Visit: Payer: Self-pay | Admitting: Family

## 2024-05-02 DIAGNOSIS — I10 Essential (primary) hypertension: Secondary | ICD-10-CM

## 2024-05-08 ENCOUNTER — Ambulatory Visit: Admitting: Internal Medicine

## 2024-05-26 ENCOUNTER — Encounter: Payer: Self-pay | Admitting: Neurology

## 2024-05-26 ENCOUNTER — Other Ambulatory Visit: Payer: Self-pay | Admitting: Neurology

## 2024-05-26 ENCOUNTER — Ambulatory Visit: Payer: 59 | Admitting: Neurology

## 2024-05-26 VITALS — BP 150/97 | HR 106 | Ht 65.0 in | Wt 173.5 lb

## 2024-05-26 DIAGNOSIS — R569 Unspecified convulsions: Secondary | ICD-10-CM | POA: Diagnosis not present

## 2024-05-26 DIAGNOSIS — Z5181 Encounter for therapeutic drug level monitoring: Secondary | ICD-10-CM | POA: Diagnosis not present

## 2024-05-26 MED ORDER — TOPIRAMATE ER 200 MG PO CAP24: 200.0000 mg | ORAL_CAPSULE | Freq: Two times a day (BID) | ORAL | 3 refills | Status: DC
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
## 2024-05-26 NOTE — Patient Instructions (Signed)
 Switch topiramate  to topiramate  extended release 200 mg twice daily Will obtain topiramate  level today with CMP 3-day ambulatory EEG Please call us  if you do have another event Follow-up in 6 months or sooner if worse.

## 2024-05-27 ENCOUNTER — Ambulatory Visit: Payer: Self-pay | Admitting: Neurology

## 2024-05-27 ENCOUNTER — Telehealth: Payer: Self-pay

## 2024-05-27 LAB — COMPREHENSIVE METABOLIC PANEL WITH GFR
ALT: 25 IU/L (ref 0–32)
AST: 23 IU/L (ref 0–40)
Albumin: 4.6 g/dL (ref 3.8–4.9)
Alkaline Phosphatase: 80 IU/L (ref 44–121)
BUN/Creatinine Ratio: 23 (ref 9–23)
BUN: 18 mg/dL (ref 6–24)
Bilirubin Total: 0.2 mg/dL (ref 0.0–1.2)
CO2: 17 mmol/L — ABNORMAL LOW (ref 20–29)
Calcium: 9.8 mg/dL (ref 8.7–10.2)
Chloride: 103 mmol/L (ref 96–106)
Creatinine, Ser: 0.78 mg/dL (ref 0.57–1.00)
Globulin, Total: 2.6 g/dL (ref 1.5–4.5)
Glucose: 104 mg/dL — ABNORMAL HIGH (ref 70–99)
Potassium: 4 mmol/L (ref 3.5–5.2)
Sodium: 140 mmol/L (ref 134–144)
Total Protein: 7.2 g/dL (ref 6.0–8.5)
eGFR: 90 mL/min/{1.73_m2} (ref >59)

## 2024-05-27 LAB — TOPIRAMATE LEVEL: Topiramate Lvl: 14 ug/mL (ref 2.0–25.0)

## 2024-06-04 ENCOUNTER — Encounter: Payer: Self-pay | Admitting: Neurology

## 2024-06-04 ENCOUNTER — Other Ambulatory Visit: Payer: Self-pay | Admitting: Neurology

## 2024-06-04 MED ORDER — CENOBAMATE 14 X 12.5 MG & 14 X 25 MG PO TBPK: ORAL_TABLET | ORAL | 0 refills | Status: DC

## 2024-06-04 MED ORDER — TOPIRAMATE 100 MG PO TABS: 100.0000 mg | ORAL_TABLET | Freq: Two times a day (BID) | ORAL | 3 refills | Status: DC

## 2024-06-04 MED ORDER — CENOBAMATE 14 X 50 MG & 14 X100 MG PO TBPK: ORAL_TABLET | ORAL | 0 refills | Status: DC

## 2024-06-05 DIAGNOSIS — G40909 Epilepsy, unspecified, not intractable, without status epilepticus: Secondary | ICD-10-CM | POA: Diagnosis not present

## 2024-06-06 DIAGNOSIS — G40909 Epilepsy, unspecified, not intractable, without status epilepticus: Secondary | ICD-10-CM | POA: Diagnosis not present

## 2024-06-07 DIAGNOSIS — G40909 Epilepsy, unspecified, not intractable, without status epilepticus: Secondary | ICD-10-CM | POA: Diagnosis not present

## 2024-06-08 DIAGNOSIS — G40909 Epilepsy, unspecified, not intractable, without status epilepticus: Secondary | ICD-10-CM | POA: Diagnosis not present

## 2024-06-10 ENCOUNTER — Telehealth: Payer: Self-pay | Admitting: Neurology

## 2024-06-10 MED ORDER — VALTOCO 20 MG DOSE 2 X 10 MG/0.1ML NA LQPK: NASAL | 2 refills | Status: DC

## 2024-06-10 NOTE — Telephone Encounter (Signed)
 Call to patient, she reports having really strong auras with buzzing in her ears, tingling in her face and hands. She had her ambulatory EEG that ended on Sunday and she states she knows she had seizures during the recording time bur was unable to hit the button. She says since decreasing the topamax  and adding xcorpi her auras have been worse. She is also asking about a rescue medication. Valtoco pended for review. Routed to Dr. Samara Crest for advice and recommendations

## 2024-06-10 NOTE — Telephone Encounter (Signed)
 Please advised her to increase Topiramate  back to 200 mg twice daily and to continue with Cenobamate . Please contact us  if you experience any side effects.

## 2024-06-10 NOTE — Telephone Encounter (Signed)
 Pt  call  to request for emergency Medication Pt states that  she have been having really bad  tremors and feeling really nausea , Pt states she feel like she is heading to have a seizure . PT thinks that once medication was lowered  these side effect started to happen . topiramate  (TOPAMAX ) 100 MG tablet

## 2024-06-11 MED ORDER — TOPIRAMATE 100 MG PO TABS: 200.0000 mg | ORAL_TABLET | Freq: Two times a day (BID) | ORAL | 3 refills | Status: DC

## 2024-06-11 NOTE — Addendum Note (Signed)
 Addended by: Genora Kidd on: 06/11/2024 06:42 AM   Modules accepted: Orders

## 2024-06-12 NOTE — Telephone Encounter (Signed)
 Pt has called asking it be noted that she has been told the recent medication called in isn't covered by her insurance, she is asking to be called to discuss her options.

## 2024-06-15 ENCOUNTER — Telehealth: Payer: Self-pay

## 2024-06-15 ENCOUNTER — Other Ambulatory Visit (HOSPITAL_COMMUNITY): Payer: Self-pay

## 2024-06-15 ENCOUNTER — Encounter: Payer: Self-pay | Admitting: Neurology

## 2024-06-15 MED ORDER — NAYZILAM 5 MG/0.1ML NA SOLN: NASAL | 2 refills | Status: AC

## 2024-06-15 NOTE — Telephone Encounter (Signed)
 Pharmacy Patient Advocate Encounter  Received notification from [facility] that Prior Authorization for Valtoco  20 MG Dose 2 x 10MG /0.1ML liquid  has been DENIED.  Full denial letter will be uploaded to the media tab. See denial reason below.   PA #/Case ID/Reference #: PA Case ID #: [id number]  MUST TRY NAYZILAM-Preferred product

## 2024-06-15 NOTE — Telephone Encounter (Signed)
 Done. Thanks.

## 2024-06-15 NOTE — Telephone Encounter (Signed)
 Pharmacy Patient Advocate Encounter   Received notification from Physician's Office that prior authorization for Valtoco  20 MG Dose 2 x 10MG /0.1ML liquid  is required/requested.   Insurance verification completed.   The patient is insured through [facility] .   Per test claim: PA required; PA submitted to above mentioned insurance via CoverMyMeds Key/confirmation #/EOC [id number] Status is pending

## 2024-06-16 ENCOUNTER — Encounter: Payer: Self-pay | Admitting: Neurology

## 2024-06-19 ENCOUNTER — Encounter (INDEPENDENT_AMBULATORY_CARE_PROVIDER_SITE_OTHER): Payer: Self-pay | Admitting: Neurology

## 2024-06-19 DIAGNOSIS — R569 Unspecified convulsions: Secondary | ICD-10-CM

## 2024-06-19 NOTE — Procedures (Signed)
 Study Start Date/Time: 06/05/2024 1258 Study End Date/Time: 06/08/2024 0116   Clinical History: This is a 53 y/o F who presents with recent seizure with no missed medication dose. Sleep schedule has been changed dramatically recently.  INTERMITTENT MONITORING with VIDEO TECHNICAL SUMMARY: This AVEEG was performed using equipment provided by [employer] utilizing Bluetooth ( Trackit ) amplifiers with continuous EEGT attended video collection using encrypted remote transmission via [employer] secured cellular tower network with data rates for each AVEEG performed. This is a Therapist, music AVEEG, obtained, according to the 10-20 international electrode placement system, reformatted digitally into referential and bipolar montages. Data was acquired with a minimum of 21 bipolar connections and sampled at a minimum rate of 250 cycles per second per channel, maximum rate of 450 cycles per second per channel and two channels for EKG. The entire VEEG study was recorded through cable and or radio telemetry for subsequent analysis. Specified epochs of the AVEEG data were identified at the direction of the subject by the depression of a push button by the patient. Each patients event file included data acquired two minutes prior to the push button activation and continuing until two minutes afterwards. AVEEG files were reviewed on [employer] [facility] server, Licensed Software provided by [employer] with a digital high frequency filter set at 70 Hz and a low frequency filter set at 1 Hz with a paper speed of 95mm/s resulting in 10 seconds per digital page. This entire AVEEG was reviewed by the EEG Technologist. Random time samples, random sleep samples, clips, patient initiated push button files with included patient daily diary logs, EEG Technologist pruned data was reviewed and verified for accuracy and validity by the governing reading neurologist in full details. This AEEGV was fully compliant  with all requirements for CPT 97500 for setup, patient education, take down and administered by an EEG technologist.  Long-Term EEG with Video was monitored intermittently by a qualified EEG technologist for the entirety of the recording; quality check-ins were performed at a minimum of every two hours, checking and documenting real-time data and video to assure the integrity and quality of the recording (e.g., camera position, electrode integrity and impedance), and identify the need for maintenance. For intermittent monitoring, an EEG Technologist monitored no more than 12 patients concurrently. Diagnostic video was captured at least 80% of the time during the recording.  PATIENT EVENTS: There were no patient events noted or captured during this recording.  TECHNOLOGIST EVENTS: No clear epileptiform activity was detected by the reviewing neurodiagnostic technologist for further review.  TIME SAMPLES: 10-minutes of every 2 hours recorded are reviewed as random time samples.  SLEEP SAMPLES: 5-minutes of every 24 hours recorded are reviewed as random sleep samples.  AWAKE: At maximal level of alertness, the posterior dominant background activity was continuous, reactive, low voltage rhythm of 10-10.5 Hz. This was symmetric, well-modulated, and attenuated with eye opening. Diffuse, symmetric, frontocentral beta range activity was present  SLEEP: N1 Sleep (Stage 1) was observed and characterized by the disappearance of alpha rhythm and the appearance of vertex activity.  N2 Sleep (Stage 2) was observed and characterized by vertex waves, K-complexes, and sleep spindles.  N3 (Stage 3) sleep was observed and characterized by high amplitude Delta activity of 20%.  REM sleep was observed.  EKG: There were no arrhythmias or abnormalities noted during this recording.  Impression: Normal EEG: awake and asleep   Clinical Correlation: This is a normal 3-day ambulatory EEG tracing. No focal  abnormalities  or epileptiform discharges were seen. There were no electrographic seizures noted. No events were captured during the recording. Please note a normal EEG does not exclude the diagnosis of epilepsy.   Adelene Polivka, MD [facility]

## 2024-06-22 ENCOUNTER — Telehealth: Payer: Self-pay

## 2024-06-22 ENCOUNTER — Other Ambulatory Visit (HOSPITAL_COMMUNITY): Payer: Self-pay

## 2024-06-22 NOTE — Telephone Encounter (Signed)
 Pharmacy Patient Advocate Encounter   Received notification from Patient Advice Request messages that prior authorization for Nayzilam 5MG /0.1ML solution is required/requested.   Insurance verification completed.   The patient is insured through [facility] .   Per test claim: PA required; PA submitted to above mentioned insurance via CoverMyMeds Key/confirmation #/EOC [id number] Status is pending

## 2024-06-22 NOTE — Telephone Encounter (Signed)
 Pharmacy Patient Advocate Encounter  Received notification from [facility] that Prior Authorization for Nayzilam 5MG /0. solution has been APPROVED from 06/22/2024 to 06/22/2025. Ran test claim, Copay is $20.00 per 15DS or 2 each. This test claim was processed through [facility]- copay amounts may vary at other pharmacies due to pharmacy/plan contracts, or as the patient moves through the different stages of their insurance plan.   PA #/Case ID/Reference #: PA Case ID #: [id number]

## 2024-06-24 ENCOUNTER — Telehealth: Payer: Self-pay | Admitting: Neurology

## 2024-06-24 NOTE — Telephone Encounter (Signed)
 Pt states that all symptoms that she was having has  stopped ever since medication Change . Pt wanted to know if Md want her to start Higher dosage of  medication  or does he want to keep dosage she was prescribe  [id number] [place] [place] - [address] Phone: [phone]  Fax: [phone]

## 2024-06-24 NOTE — Telephone Encounter (Signed)
 Call to patient, she states that she is still taking the Topamax  400 mg twice daily and is at the 25 mg Xcopri  daily and feeling the best she has felt in a year. She is not having auras or tingling and wants to know is she can stay at 25 mg or continue the titration as prescribed. Advised I would send to Dr. Gregg for review.

## 2024-06-25 NOTE — Telephone Encounter (Signed)
 Please advise her to try to 50 and 100 and monitor side effect, because I want to reduce her topamax .

## 2024-07-09 ENCOUNTER — Other Ambulatory Visit: Payer: Self-pay | Admitting: Neurology

## 2024-07-20 ENCOUNTER — Telehealth: Payer: Self-pay | Admitting: Neurology

## 2024-07-20 ENCOUNTER — Encounter: Payer: Self-pay | Admitting: Neurology

## 2024-07-20 MED ORDER — TOPIRAMATE 100 MG PO TABS: 200.0000 mg | ORAL_TABLET | Freq: Two times a day (BID) | ORAL | 0 refills | Status: DC

## 2024-07-20 MED ORDER — XCOPRI 100 MG PO TABS: 1.0000 | ORAL_TABLET | Freq: Every day | ORAL | 3 refills | Status: DC

## 2024-07-20 NOTE — Telephone Encounter (Signed)
 Call to patient, she reports she is going out of town Sunday and needs xcopri  sent to pharmacy. She is aware that the plan was to decrease Topamax  once she was on Xcopri  for a while  but she is feeling very good with both medications at current dose. If plan is still to wean, she would like to wait until after she returns home. Advised I would send to Dr. Gregg for review. Patient appreciative and ok with my chart response.

## 2024-07-20 NOTE — Telephone Encounter (Signed)
 Pt called stating that she is needing a refill on her Xcopri  and she is needing it sent in as soon as possible due to her pharmacy taking up to a week to get the medication in. Pt states that she was to have a change in her topiramate  (TOPAMAX ) 100 MG tablet but will be going out of town on Sunday and did not want to start the change until after she gets back. She would like to speak to the nurse and discuss.

## 2024-07-20 NOTE — Telephone Encounter (Signed)
 Yes, she can.

## 2024-07-20 NOTE — Telephone Encounter (Signed)
 Done. Thanks.

## 2024-07-21 ENCOUNTER — Encounter: Payer: Self-pay | Admitting: Neurology

## 2024-07-22 MED ORDER — TOPIRAMATE 200 MG PO TABS: 200.0000 mg | ORAL_TABLET | Freq: Two times a day (BID) | ORAL | 3 refills | Status: DC

## 2024-08-04 ENCOUNTER — Other Ambulatory Visit: Payer: Self-pay | Admitting: Family

## 2024-08-04 DIAGNOSIS — Z8742 Personal history of other diseases of the female genital tract: Secondary | ICD-10-CM

## 2024-08-24 ENCOUNTER — Encounter: Payer: Self-pay | Admitting: Neurology

## 2024-08-24 NOTE — Telephone Encounter (Signed)
 Agree with recs. Regular Claritin or Zyrtec  is fine.

## 2024-08-28 ENCOUNTER — Encounter: Payer: Self-pay | Admitting: Internal Medicine

## 2024-08-28 ENCOUNTER — Ambulatory Visit: Attending: Internal Medicine | Admitting: Internal Medicine

## 2024-08-28 VITALS — BP 110/70 | HR 79 | Ht 66.0 in | Wt 167.4 lb

## 2024-08-28 DIAGNOSIS — I10 Essential (primary) hypertension: Secondary | ICD-10-CM | POA: Diagnosis not present

## 2024-08-28 DIAGNOSIS — Z79899 Other long term (current) drug therapy: Secondary | ICD-10-CM | POA: Diagnosis not present

## 2024-08-28 DIAGNOSIS — E781 Pure hyperglyceridemia: Secondary | ICD-10-CM

## 2024-08-28 MED ORDER — VALSARTAN 160 MG PO TABS: 160.0000 mg | ORAL_TABLET | Freq: Every day | ORAL | 3 refills | Status: AC

## 2024-08-28 NOTE — Patient Instructions (Signed)
 Medication Instructions:  Your physician recommends the following medication changes.  DECREASE: Valsartan  to 160 mg by mouth daily   *If you need a refill on your cardiac medications before your next appointment, please call your pharmacy*  Lab Work: Your provider would like for you to return in 2 weeks to have the following labs drawn: CMP, Lipid.   Please go to [facility] [address] ([facility] [address], [place] [address] You do not need an appointment.  They are open from 8 am- 4:30 pm.  Lunch from 1:00 pm- 2:00 pm You will need to be fasting.    Testing/Procedures: No test ordered today   Follow-Up: At [facility], you and your health needs are our priority.  As part of our continuing mission to provide you with exceptional heart care, our providers are all part of one team.  This team includes your primary Cardiologist (physician) and Advanced Practice Providers or APPs (Physician Assistants and Nurse Practitioners) who all work together to provide you with the care you need, when you need it.  Your next appointment:   1 month(s)  Provider:   You will see one of the following Advanced Practice Providers on your designated Care Team:   Lonni Meager, NP Lesley Maffucci, PA-C Bernardino Bring, PA-C Cadence Claypool, PA-C Tylene Lunch, NP Barnie Hila, NP

## 2024-08-28 NOTE — Progress Notes (Signed)
  Cardiology Office Note:  .   Date:  08/28/2024  ID:  Christine Miranda, DOB 06-14-1971, MRN [id number] PCP: Corwin Antu, FNP  [facility] Providers Cardiologist:  Lonni Hanson, MD     History of Present Illness: .   Christine Miranda is a 53 y.o. female with history of hypertension, hyperlipidemia, seizures, asthma, and eczema, who presents for follow-up of hyperlipidemia.  I met her in 12/2023, at which time she was feeling well.  We discussed pharmacotherapy versus lifestyle modifications to help improve her elevated triglycerides and agreed to a trial of lifestyle modification with close follow-up of her lipids.  Today, Ms. [name] reports that she has been feeling fairly well though she is concerned by frequent low blood pressures that she first began after she was started on the new seizure medication (Xcopri ).  She feels somewhat lethargic when her blood pressure is low.  She also has occasional lightheadedness.  She denies chest pain, palpitations, syncope, and edema.  She feels a little short of breath, consistent with a mild asthma flare.  ROS: See HPI  Studies Reviewed: SABRA   EKG Interpretation Date/Time:  Friday August 28 2024 08:17:18 EDT Ventricular Rate:  79 PR Interval:  190 QRS Duration:  80 QT Interval:  386 QTC Calculation: 442 R Axis:   18  Text Interpretation: Normal sinus rhythm Low voltage QRS Borderline ECG When compared with ECG of 12-Dec-2023 08:41, No significant change was found Confirmed by Kadeidra Coryell, Lonni [phone]) on 08/28/2024 8:22:33 AM    Risk Assessment/Calculations:             Physical Exam:   VS:  BP 110/70 (BP Location: Left Arm, Patient Position: Sitting, Cuff Size: Normal)   Pulse 79   Ht 5' 6 (1.676 m)   Wt 167 lb 6.4 oz (75.9 kg)   SpO2 99%   BMI 27.02 kg/m    Wt Readings from Last 3 Encounters:  08/28/24 167 lb 6.4 oz (75.9 kg)  05/26/24 173 lb 8 oz (78.7 kg)  12/12/23 175 lb 6 oz (79.5 kg)    General:   NAD. Neck: No JVD or HJR. Lungs: Clear to auscultation bilaterally without wheezes or crackles. Heart: Regular rate and rhythm without murmurs, rubs, or gallops. Abdomen: Soft, nontender, nondistended. Extremities: No lower extremity edema.  ASSESSMENT AND PLAN: .    Hypertension: Blood pressure normal today but sometimes low at home, which began after [name] was started on cenobamate  for management of her seizures.  We have agreed to cut her valsartan  in half from 320 mg daily to 160 mg daily.  Continue current dose of triamterene -HCTZ for now.  We will check a CMP in about 2 weeks to ensure stable renal function and electrolytes.  Hypertriglyceridemia: This Hankey has been trying to work on her diet.  Unfortunately, she has not had repeat lipids drawn since her last visit.  We will check a fasting lipid panel when she returns for her CMP in 2 weeks.  Defer pharmacotherapy for now.    Dispo: Return to clinic in 1 month with APP to reassess BP.  Signed, Lonni Hanson, MD

## 2024-10-23 ENCOUNTER — Ambulatory Visit: Admitting: Internal Medicine

## 2024-11-05 ENCOUNTER — Other Ambulatory Visit: Payer: Self-pay | Admitting: Family

## 2024-11-05 DIAGNOSIS — I10 Essential (primary) hypertension: Secondary | ICD-10-CM

## 2024-11-13 ENCOUNTER — Encounter: Payer: Self-pay | Admitting: Family

## 2024-11-13 ENCOUNTER — Ambulatory Visit (INDEPENDENT_AMBULATORY_CARE_PROVIDER_SITE_OTHER): Admitting: Family

## 2024-11-13 VITALS — BP 152/98 | HR 84 | Temp 98.0°F | Wt 172.0 lb

## 2024-11-13 DIAGNOSIS — I10 Essential (primary) hypertension: Secondary | ICD-10-CM

## 2024-11-13 DIAGNOSIS — J0100 Acute maxillary sinusitis, unspecified: Secondary | ICD-10-CM

## 2024-11-13 LAB — BASIC METABOLIC PANEL WITH GFR
BUN: 14 mg/dL (ref 6–23)
CO2: 26 meq/L (ref 19–32)
Calcium: 9 mg/dL (ref 8.4–10.5)
Chloride: 105 meq/L (ref 96–112)
Creatinine, Ser: 0.73 mg/dL (ref 0.40–1.20)
GFR: 93.1 mL/min (ref >60.00)
Glucose, Bld: 89 mg/dL (ref 70–99)
Potassium: 3.6 meq/L (ref 3.5–5.1)
Sodium: 141 meq/L (ref 135–145)

## 2024-11-13 MED ORDER — TRIAMTERENE-HCTZ 37.5-25 MG PO TABS: 1.0000 | ORAL_TABLET | Freq: Every day | ORAL | 1 refills | Status: DC

## 2024-11-13 MED ORDER — AMOXICILLIN-POT CLAVULANATE 875-125 MG PO TABS: 1.0000 | ORAL_TABLET | Freq: Two times a day (BID) | ORAL | 0 refills | Status: DC

## 2024-11-13 NOTE — Progress Notes (Signed)
 Established Patient Office Visit  Subjective:      CC:  Chief Complaint  Patient presents with   Medical Management of Chronic Issues    Needs medications refilled.    HPI: Christine Miranda is a 53 y.o. female presenting on 11/13/2024 for Medical Management of Chronic Issues (Needs medications refilled.) .  Discussed the use of AI scribe software for clinical note transcription with the patient, who gave verbal consent to proceed.  History of Present Illness Christine Miranda is a 53 year old female with hypertension and seizure disorder who presents with medication refill and recurrent respiratory symptoms.  She has been out of her blood pressure medication, triamterene  hydrochlorothiazide , for two days as it was not sent to her pharmacy. Her cardiologist advised her to have her GP refill it since she has been unable to attend cardiology appointments due to illness. She monitors her blood pressure at home, noting readings between 110/70 and 170/100, with higher readings associated with coughing episodes.  Over the past two months, she has experienced recurrent episodes of illness characterized by fever, cough, and congestion. The most recent episode began four weeks ago, with a fever occurring three days ago. She experiences persistent coughing, loss of voice, and congestion, primarily in the upper respiratory tract. She reports intermittent ear pain and had a sore throat at the onset of the illness. She has taken several COVID tests, all negative, with the last test three days ago when she suspected a fever.  Her recurrent illnesses have triggered her seizure disorder, with seizures occurring when she runs a fever. She last experienced a seizure a month ago, which was associated with dehydration and illness following travel. She is currently on Topamax  and Xcopri  for seizure management. She avoids decongestants due to potential interactions with her seizure medications.  She  is currently taking Zyrtec  for allergies but reports that her symptoms persist despite this. She is immunocompromised due to [facility], which she has paused until her current illness resolves. She is cautious about medication interactions due to her seizure disorder.         Social history:  Relevant past medical, surgical, family and social history reviewed and updated as indicated. Interim medical history since our last visit reviewed.  Allergies and medications reviewed and updated.  DATA REVIEWED: CHART IN EPIC     ROS: Negative unless specifically indicated above in HPI.    Current Outpatient Medications:    albuterol  (VENTOLIN  HFA) 108 (90 Base) MCG/ACT inhaler, Inhale 1-2 puffs into the lungs as needed., Disp: , Rfl:    amoxicillin -clavulanate (AUGMENTIN ) 875-125 MG tablet, Take 1 tablet by mouth 2 (two) times daily., Disp: 20 tablet, Rfl: 0   Cenobamate  (XCOPRI ) 100 MG TABS, Take 1 tablet (100 mg total) by mouth daily., Disp: 30 tablet, Rfl: 3   metFORMIN  (GLUCOPHAGE ) 500 MG tablet, TAKE 1 TABLET BY MOUTH 2 TIMES DAILY WITH A MEAL., Disp: 60 tablet, Rfl: 11   Midazolam  (NAYZILAM ) 5 MG/0.1ML SOLN, Spray 1 vial into 1 nostril for 1 dose at onset of convulsion and call 911, Disp: 3 each, Rfl: 2   SKYRIZI PEN 150 MG/ML SOAJ, Inject 150 mg into the skin every 3 (three) months., Disp: , Rfl:    topiramate  (TOPAMAX ) 200 MG tablet, Take 1 tablet (200 mg total) by mouth 2 (two) times daily., Disp: 60 tablet, Rfl: 3   valsartan  (DIOVAN ) 160 MG tablet, Take 1 tablet (160 mg total) by mouth daily., Disp: 90 tablet, Rfl: 3  triamterene -hydrochlorothiazide  (MAXZIDE-25) 37.5-25 MG tablet, Take 1 tablet by mouth daily., Disp: 30 tablet, Rfl: 1        Objective:        BP (!) 152/98 (BP Location: Right Arm, Patient Position: Sitting, Cuff Size: Normal)   Pulse 84   Temp 98 F (36.7 C) (Temporal)   Wt 172 lb (78 kg)   SpO2 98%   BMI 27.76 kg/m   Physical Exam HEENT: Sinus  tenderness present.  Wt Readings from Last 3 Encounters:  11/13/24 172 lb (78 kg)  08/28/24 167 lb 6.4 oz (75.9 kg)  05/26/24 173 lb 8 oz (78.7 kg)    Physical Exam Constitutional:      General: She is not in acute distress.    Appearance: Normal appearance. She is normal weight. She is not ill-appearing, toxic-appearing or diaphoretic.  HENT:     Head: Normocephalic.     Right Ear: Tympanic membrane normal.     Left Ear: Tympanic membrane normal.     Nose:     Right Sinus: Maxillary sinus tenderness present.     Left Sinus: Maxillary sinus tenderness present.     Mouth/Throat:     Mouth: Mucous membranes are dry.     Pharynx: No oropharyngeal exudate or posterior oropharyngeal erythema.  Eyes:     Extraocular Movements: Extraocular movements intact.     Pupils: Pupils are equal, round, and reactive to light.  Cardiovascular:     Rate and Rhythm: Normal rate and regular rhythm.     Pulses: Normal pulses.     Heart sounds: Normal heart sounds.  Pulmonary:     Effort: Pulmonary effort is normal.     Breath sounds: Normal breath sounds.  Musculoskeletal:     Cervical back: Normal range of motion.  Neurological:     General: No focal deficit present.     Mental Status: She is alert and oriented to person, place, and time. Mental status is at baseline.  Psychiatric:        Mood and Affect: Mood normal.        Behavior: Behavior normal.        Thought Content: Thought content normal.        Judgment: Judgment normal.          Results   Assessment & Plan:   Assessment and Plan Assessment & Plan Recurrent upper respiratory infection Symptoms persisting for over a month, including cough, congestion, and voice loss. Recent fever three days ago. COVID-19 negative. Symptoms exacerbated by immunosuppression due to Skyrizi therapy. - Prescribed Augmentin  for potential sinusitis and bronchitis coverage -Take antibiotic as prescribed. Increase oral fluids. Pt to f/u if sx  worsen and or fail to improve in 2-3 days.   Essential hypertension Blood pressure readings range from 110 to 170 systolic. Non-compliance with triamterene  due to medication refill issues. Blood pressure elevated today, possibly due to coughing. - Refilled triamterene  prescription - Advised accurate blood pressure monitoring, especially after medication intake - Ordered potassium level check due to triamterene  use  Seizure disorder Seizures triggered by fever and dehydration. Recent seizure episode one month ago. Current medications include Topamax  and Xscorpi. 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 Currently unable to take Skyrizi due to ongoing infection. - Continue to hold Skyrizi until infection resolves        Return  in about 6 months (around 05/13/2025) for f/u CPE.     Ginger Patrick, MSN, APRN, FNP-C [facility]

## 2024-11-15 ENCOUNTER — Other Ambulatory Visit: Payer: Self-pay | Admitting: Neurology

## 2024-11-16 ENCOUNTER — Ambulatory Visit: Payer: Self-pay | Admitting: Family

## 2024-11-30 ENCOUNTER — Encounter: Payer: Self-pay | Admitting: Neurology

## 2024-11-30 MED ORDER — XCOPRI 100 MG PO TABS: 1.0000 | ORAL_TABLET | Freq: Every day | ORAL | 5 refills | Status: DC

## 2024-11-30 NOTE — Telephone Encounter (Signed)
 Requested Prescriptions   Pending Prescriptions Disp Refills   Cenobamate  (XCOPRI ) 100 MG TABS 30 tablet 3    Sig: Take 1 tablet (100 mg total) by mouth daily.   Last seen 05/26/24 Next appt 12/14/24  Dispenses   Dispensed Days Supply Quantity Provider Pharmacy  XCOPRI  100 MG TABLET 10/30/2024 30 30 each Gregg Lek, MD [phone]  XCOPRI  100 MG TABLET 09/25/2024 30 30 each Camara, Amadou, MD [phone] [facility]...  XCOPRI  100 MG TABLET 08/24/2024 30 30 each Camara, Amadou, MD [phone] - [facility]...  XCOPRI  100 MG TABLET 07/20/2024 30 30 each Camara, Amadou, MD [facility] [phone] - [facility]...  XCOPRI  12.5-25 MG TITRATION PK 06/05/2024 28 28 each Gregg Lek, MD [facility] [phone] [facility]...  XCOPRI  50-100 MG TITRATION PAK 06/05/2024 28 28 each Gregg Lek, MD [phone] - [email or web address]

## 2024-12-06 ENCOUNTER — Other Ambulatory Visit: Payer: Self-pay | Admitting: Family

## 2024-12-06 DIAGNOSIS — I10 Essential (primary) hypertension: Secondary | ICD-10-CM

## 2024-12-14 ENCOUNTER — Ambulatory Visit: Admitting: Neurology

## 2024-12-14 ENCOUNTER — Encounter: Payer: Self-pay | Admitting: Neurology

## 2024-12-14 VITALS — BP 117/76 | HR 94 | Resp 16 | Ht 66.0 in | Wt 169.5 lb

## 2024-12-14 DIAGNOSIS — G43009 Migraine without aura, not intractable, without status migrainosus: Secondary | ICD-10-CM | POA: Diagnosis not present

## 2024-12-14 DIAGNOSIS — R569 Unspecified convulsions: Secondary | ICD-10-CM

## 2024-12-14 MED ORDER — TOPIRAMATE 200 MG PO TABS: 200.0000 mg | ORAL_TABLET | Freq: Two times a day (BID) | ORAL | 3 refills | Status: AC

## 2024-12-14 NOTE — Patient Instructions (Addendum)
 Continue with Cenobamate  100 mg daily Continue with topiramate  200 mg twice daily Please call for any additional concerns Follow-up in a year or sooner if worse
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
## 2024-12-17 DIAGNOSIS — Z79899 Other long term (current) drug therapy: Secondary | ICD-10-CM | POA: Diagnosis not present

## 2024-12-18 ENCOUNTER — Ambulatory Visit: Payer: Self-pay | Admitting: Internal Medicine

## 2024-12-18 LAB — COMPREHENSIVE METABOLIC PANEL WITH GFR
ALT: 17 IU/L (ref 0–32)
AST: 21 IU/L (ref 0–40)
Albumin: 4.1 g/dL (ref 3.8–4.9)
Alkaline Phosphatase: 70 IU/L (ref 49–135)
BUN/Creatinine Ratio: 29 — ABNORMAL HIGH (ref 9–23)
BUN: 22 mg/dL (ref 6–24)
Bilirubin Total: 0.3 mg/dL (ref 0.0–1.2)
CO2: 20 mmol/L (ref 20–29)
Calcium: 9.1 mg/dL (ref 8.7–10.2)
Chloride: 104 mmol/L (ref 96–106)
Creatinine, Ser: 0.76 mg/dL (ref 0.57–1.00)
Globulin, Total: 2.2 g/dL (ref 1.5–4.5)
Glucose: 90 mg/dL (ref 70–99)
Potassium: 3.7 mmol/L (ref 3.5–5.2)
Sodium: 142 mmol/L (ref 134–144)
Total Protein: 6.3 g/dL (ref 6.0–8.5)
eGFR: 93 mL/min/1.73

## 2024-12-18 LAB — LIPID PANEL
Chol/HDL Ratio: 5.8 ratio — ABNORMAL HIGH (ref 0.0–4.4)
Cholesterol, Total: 184 mg/dL (ref 100–199)
HDL: 32 mg/dL — ABNORMAL LOW
LDL Chol Calc (NIH): 97 mg/dL (ref 0–99)
Triglycerides: 327 mg/dL — ABNORMAL HIGH (ref 0–149)
VLDL Cholesterol Cal: 55 mg/dL — ABNORMAL HIGH (ref 5–40)

## 2024-12-18 MED ORDER — FISH OIL 1000 MG PO CAPS: 2.0000 | ORAL_CAPSULE | Freq: Every day | ORAL | Status: AC

## 2025-01-05 NOTE — Progress Notes (Unsigned)
" °  Cardiology Office Note:  .   Date:  01/06/2025  ID:  Christine Miranda, DOB 05-15-1971, MRN [id number] PCP: Corwin Antu, FNP  [facility] Providers Cardiologist:  Lonni Hanson, MD     History of Present Illness: .   Christine Miranda is a 54 y.o. female with history of hypertension, hyperlipidemia, seizures, asthma, and eczema, presents for follow-up of hypertension and hyperlipidemia.  I last saw her in August, at which time she was concerning about frequent episodes of low blood pressure that began after she was started on her new seizure medication (Xcopri ).  We agreed to reduce valsartan  to 160 mg daily.  Today, Ms. Couse reports that her blood pressure has been much better without any high or low readings.  She denies lightheadedness as well as chest pain, shortness of breath, and palpitations.  She notes some mild lower extremity edema during a trip to [place], but believes this was due to sitting in a car for 10 hours and drinking less water.  She has not started taking her fish oil  supplement because of unpleasant taste associated with this.  She plans to consult with a pharmacist about finding a formulation that is most tolerable for her.  ROS: See HPI  Studies Reviewed: .        Risk Assessment/Calculations:             Physical Exam:   VS:  BP 122/88 (BP Location: Left Arm, Patient Position: Sitting, Cuff Size: Normal)   Pulse 96   Ht 5' 6 (1.676 m)   Wt 172 lb 6.4 oz (78.2 kg)   SpO2 98%   BMI 27.83 kg/m    Wt Readings from Last 3 Encounters:  01/06/25 172 lb 6.4 oz (78.2 kg)  12/14/24 169 lb 8 oz (76.9 kg)  11/13/24 172 lb (78 kg)    General:  NAD. Neck: No JVD or HJR. Lungs: Clear to auscultation bilaterally without wheezes or crackles. Heart: Regular rate and rhythm without murmurs, rubs, or gallops. Abdomen: Soft, nontender, nondistended. Extremities: No lower extremity edema.  ASSESSMENT AND PLAN: .    Hypertension: Blood pressure  well-controlled today without any symptomatic hypotension reported at home following de-escalation of valsartan .  Continue current regimen of valsartan  and triamterene -HCTZ.  Hypertriglyceridemia: Lipid panel last month was notable for improved but still elevated triglycerides of 327.  Ms. Raulerson is concerned about retrying statins due to myalgias in the past.  She will consult with a pharmacy friend as to which fish oil  formulation may provide the fewest GI side effects.  We will plan to repeat a fasting lipid panel and ALT about 3 months after she begins taking omega-3 fatty acid.    Dispo: Return to clinic in 1 year.  Signed, Lonni Hanson, MD  "

## 2025-01-06 ENCOUNTER — Encounter: Payer: Self-pay | Admitting: Internal Medicine

## 2025-01-06 ENCOUNTER — Ambulatory Visit: Attending: Internal Medicine | Admitting: Internal Medicine

## 2025-01-06 VITALS — BP 122/88 | HR 96 | Ht 66.0 in | Wt 172.4 lb

## 2025-01-06 DIAGNOSIS — E781 Pure hyperglyceridemia: Secondary | ICD-10-CM | POA: Diagnosis not present

## 2025-01-06 DIAGNOSIS — Z79899 Other long term (current) drug therapy: Secondary | ICD-10-CM

## 2025-01-06 DIAGNOSIS — I10 Essential (primary) hypertension: Secondary | ICD-10-CM

## 2025-01-06 NOTE — Patient Instructions (Signed)
 Medication Instructions:  Your physician recommends that you continue on your current medications as directed. Please refer to the Current Medication list given to you today.    *If you need a refill on your cardiac medications before your next appointment, please call your pharmacy*  Lab Work: Your provider would like for you to return in April, 2026 to have the following labs drawn: Lipid, ALT.   Please go to [facility] [address] [address], [place] [address] You do not need an appointment.  They are open from 8 am- 4:30 pm.  Lunch from 1:00 pm- 2:00 pm You will need to be fasting.    Testing/Procedures: No test ordered today   Follow-Up: At [facility], you and your health needs are our priority.  As part of our continuing mission to provide you with exceptional heart care, our providers are all part of one team.  This team includes your primary Cardiologist (physician) and Advanced Practice Providers or APPs (Physician Assistants and Nurse Practitioners) who all work together to provide you with the care you need, when you need it.  Your next appointment:   1 year(s)  Provider:   You may see Lonni Hanson, MD or one of the following Advanced Practice Providers on your designated Care Team:   Lonni Meager, NP Lesley Maffucci, PA-C Bernardino Bring, PA-C Cadence Rossmoyne, PA-C Tylene Lunch, NP Barnie Hila, NP

## 2025-01-28 ENCOUNTER — Other Ambulatory Visit: Payer: Self-pay | Admitting: Neurology

## 2025-01-28 NOTE — Telephone Encounter (Signed)
 Patient request refill for Cenobamate  (XCOPRI ) 100 MG TABS send to  [id number] -

## 2025-01-29 ENCOUNTER — Encounter: Payer: Self-pay | Admitting: Neurology

## 2025-01-29 NOTE — Telephone Encounter (Signed)
 Pt called stating Pharmacy informed that Pt need PA for medication Cenobamate  (XCOPRI ) 100 MG TABS . Pt will be out of medication Sunday   [id number] [place], [place] - [address] Phone: [phone]  Fax: [phone]

## 2025-02-01 ENCOUNTER — Telehealth: Payer: Self-pay | Admitting: *Deleted

## 2025-02-01 MED ORDER — XCOPRI 100 MG PO TABS: 1.0000 | ORAL_TABLET | Freq: Every day | ORAL | 5 refills | Status: AC

## 2025-02-01 NOTE — Progress Notes (Signed)
 Christine Miranda                                          MRN: [id number]   02/01/2025   The [facility] Quality Team Specialist reviewed this patient medical record for the purposes of chart review for care gap closure. The following were reviewed: abstraction for care gap closure-controlling blood pressure for 2025.    VBCI Quality Team

## 2025-02-01 NOTE — Telephone Encounter (Signed)
 SABRA

## 2025-02-01 NOTE — Telephone Encounter (Signed)
 Requested Prescriptions   Pending Prescriptions Disp Refills   Cenobamate  (XCOPRI ) 100 MG TABS 30 tablet 5    Sig: Take 1 tablet (100 mg total) by mouth daily.   Last seen 12/14/24 Next appt 12/20/25  Dispenses   Dispensed Days Supply Quantity Provider Pharmacy  XCOPRI  100 MG TABLET 12/29/2024 30 30 each Camara, Amadou, MD [facility] [phone] - [facility]...  XCOPRI  100 MG TABLET 11/30/2024 30 30 each Camara, Amadou, MD [facility] [phone] - B...  XCOPRI  100 MG TABLET 10/30/2024 30 30 each Camara, Amadou, MD [facility] [phone] - B...  XCOPRI  100 MG TABLET 09/25/2024 30 30 each Camara, Amadou, MD [facility] [phone] - B...  XCOPRI  100 MG TABLET 08/24/2024 30 30 each Camara, Amadou, MD [facility] [phone] - B...  XCOPRI  100 MG TABLET 07/20/2024 30 30 each Camara, Amadou, MD [facility] [phone] - B...  XCOPRI  12.5-25 MG TITRATION PK 06/05/2024 28 28 each Camara, Amadou, MD [facility] [phone] - B...  XCOPRI  50-100 MG TITRATION PAK 06/05/2024 28 28 each Gregg Lek, MD [facility] [phone] - B.SABRASABRA

## 2025-02-02 ENCOUNTER — Other Ambulatory Visit (HOSPITAL_COMMUNITY): Payer: Self-pay

## 2025-02-02 ENCOUNTER — Other Ambulatory Visit: Payer: Self-pay | Admitting: Neurology

## 2025-02-02 ENCOUNTER — Telehealth: Payer: Self-pay | Admitting: Neurology

## 2025-02-02 ENCOUNTER — Other Ambulatory Visit: Payer: Self-pay

## 2025-02-02 ENCOUNTER — Telehealth: Payer: Self-pay

## 2025-02-02 MED ORDER — LACOSAMIDE 100 MG PO TABS: 100.0000 mg | ORAL_TABLET | Freq: Two times a day (BID) | ORAL | 0 refills | Status: DC

## 2025-02-02 NOTE — Telephone Encounter (Signed)
 Thank you :)

## 2025-02-02 NOTE — Telephone Encounter (Signed)
 Pharmacy Patient Advocate Encounter   Received notification from Pt Calls Messages that prior authorization for Xcopri  100MG  tablets is required/requested.   Insurance verification completed.   The patient is insured through [employer].   Per test claim: PA required; PA submitted to above mentioned insurance via Latent Key/confirmation #/EOC [id number] Status is pending

## 2025-02-02 NOTE — Telephone Encounter (Signed)
 ATTEMPTED PA AND GOT THE FOLLOWING MESSAGE:  I WILL ROUTE TO PHARMACY TEAM TO SEE IF THEY CAN HELP THIS IS URGENT!!!

## 2025-02-02 NOTE — Telephone Encounter (Signed)
 I contacted Alan regarding Xcopri , and she referred me to the [employer] program in partnership with Xcopri . Rosina from [employer] advised that they have a contract with [employer] (copay assistance) and local pharmacies that provides patients with commercial insurance an allowance of up to $5,500 towarXcopri , reducing the copay to $20 per prescription. I contacted the patient to inform her of this option until the PA is approved. She plans to visit the pharmacy today and will provide an update afterward.

## 2025-02-02 NOTE — Telephone Encounter (Signed)
 Can you please help me in making sure the prior authorization is submitted. Patient ran out of Cenobamate .   Thank you  Dr. Gregg

## 2025-02-03 ENCOUNTER — Encounter: Payer: Self-pay | Admitting: Neurology

## 2025-02-03 ENCOUNTER — Other Ambulatory Visit: Payer: Self-pay | Admitting: Neurology

## 2025-02-03 MED ORDER — CENOBAMATE 100 MG PO TABS: 100.0000 mg | ORAL_TABLET | Freq: Every day | ORAL | 0 refills | Status: DC

## 2025-02-03 NOTE — Telephone Encounter (Signed)
 The prior authorization was approved by insurance. The patient will be without medication for two days [facility]. She is currently taking 50 mg and inquired whether she should titrate back up once the medication is received.

## 2025-02-03 NOTE — Telephone Encounter (Signed)
 As soon as she received the medication, she can go up to 100 mg twice daily.

## 2025-02-03 NOTE — Telephone Encounter (Signed)
 Tina with Cenobamate  (XCOPRI ) 100 MG TABS called to request  Pt enrollment for  be faxed to them as well as order for medication   I did in formed Pt PA was approved and that [facility]  Callback number is [phone]  Fax# [phone]

## 2025-02-03 NOTE — Telephone Encounter (Signed)
 Pharmacy Patient Advocate Encounter  Received notification from [facility] [employer] that Prior Authorization for Xcopri  100MG  tablets has been APPROVED from 02-02-2025 to 02-02-2026   PA #/Case ID/Reference #: [id number]

## 2025-02-04 ENCOUNTER — Telehealth: Payer: Self-pay

## 2025-02-04 NOTE — Telephone Encounter (Signed)
 [employer] Xcopri  Enrollment form faxed.

## 2025-02-04 NOTE — Telephone Encounter (Signed)
 Thank you. The order was sent via EPIC.

## 2025-02-04 NOTE — Telephone Encounter (Signed)
 Paper work faxed to [employer].

## 2025-02-05 ENCOUNTER — Other Ambulatory Visit: Payer: Self-pay | Admitting: Neurology

## 2025-02-05 NOTE — Telephone Encounter (Signed)
 Spoke with patient advised her to:  Continue with Cenobamate  100 mg nightly  Continue with Topiramate  200 mg twice daily  DO Not pick the Vimpat .  She voiced understanding.   Dr. Wasyl Dornfeld

## 2025-02-05 NOTE — Telephone Encounter (Signed)
 go up to 100 mg twice daily.       I called and spoke to pt and Pt stated that she already went up but needs new rx of bid for xcopri   They also authorized vimpat .   Please advise on who you want her to take all meds. She hasn't started vimpat . Pt also reported issues of speech stuttering/slurring/face tingly worsening. Right hand tremor is sporadically happening  Pt is ok w/mychart msg

## 2025-02-05 NOTE — Telephone Encounter (Signed)
 The Xcopri  should be 100 mg once a day. Please start with Vimpat  50 mg (1/2 tablet) twice daily until you receive the Xcopri .

## 2025-12-20 ENCOUNTER — Ambulatory Visit: Admitting: Neurology
